# Patient Record
Sex: Female | Born: 1970 | Race: Black or African American | Hispanic: No | Smoking: Never smoker
Health system: Southern US, Community
[De-identification: ages and names within clinical notes are randomized; demographics above are authoritative.]

## PROBLEM LIST (undated history)

## (undated) DIAGNOSIS — K635 Polyp of colon: Secondary | ICD-10-CM

## (undated) DIAGNOSIS — Z86718 Personal history of other venous thrombosis and embolism: Secondary | ICD-10-CM

## (undated) DIAGNOSIS — Z8619 Personal history of other infectious and parasitic diseases: Secondary | ICD-10-CM

## (undated) DIAGNOSIS — Z87440 Personal history of urinary (tract) infections: Secondary | ICD-10-CM

## (undated) DIAGNOSIS — F419 Anxiety disorder, unspecified: Secondary | ICD-10-CM

## (undated) DIAGNOSIS — Z5189 Encounter for other specified aftercare: Secondary | ICD-10-CM

## (undated) DIAGNOSIS — N809 Endometriosis, unspecified: Secondary | ICD-10-CM

## (undated) DIAGNOSIS — R32 Unspecified urinary incontinence: Secondary | ICD-10-CM

## (undated) DIAGNOSIS — R7989 Other specified abnormal findings of blood chemistry: Secondary | ICD-10-CM

## (undated) DIAGNOSIS — F32A Depression, unspecified: Secondary | ICD-10-CM

## (undated) DIAGNOSIS — R42 Dizziness and giddiness: Secondary | ICD-10-CM

## (undated) DIAGNOSIS — F329 Major depressive disorder, single episode, unspecified: Secondary | ICD-10-CM

## (undated) DIAGNOSIS — R011 Cardiac murmur, unspecified: Secondary | ICD-10-CM

## (undated) DIAGNOSIS — IMO0001 Reserved for inherently not codable concepts without codable children: Secondary | ICD-10-CM

## (undated) DIAGNOSIS — Z8742 Personal history of other diseases of the female genital tract: Secondary | ICD-10-CM

## (undated) DIAGNOSIS — R102 Pelvic and perineal pain: Secondary | ICD-10-CM

## (undated) HISTORY — DX: Other specified abnormal findings of blood chemistry: R79.89

## (undated) HISTORY — PX: WISDOM TOOTH EXTRACTION: SHX21

## (undated) HISTORY — DX: Polyp of colon: K63.5

## (undated) HISTORY — DX: Personal history of urinary (tract) infections: Z87.440

## (undated) HISTORY — DX: Cardiac murmur, unspecified: R01.1

## (undated) HISTORY — DX: Unspecified urinary incontinence: R32

## (undated) HISTORY — DX: Depression, unspecified: F32.A

## (undated) HISTORY — DX: Personal history of other diseases of the female genital tract: Z87.42

## (undated) HISTORY — DX: Major depressive disorder, single episode, unspecified: F32.9

## (undated) HISTORY — DX: Encounter for other specified aftercare: Z51.89

## (undated) HISTORY — DX: Anxiety disorder, unspecified: F41.9

## (undated) HISTORY — PX: HERNIA REPAIR: SHX51

## (undated) HISTORY — DX: Endometriosis, unspecified: N80.9

## (undated) HISTORY — DX: Reserved for inherently not codable concepts without codable children: IMO0001

## (undated) HISTORY — DX: Personal history of other venous thrombosis and embolism: Z86.718

## (undated) HISTORY — DX: Pelvic and perineal pain: R10.2

## (undated) HISTORY — DX: Dizziness and giddiness: R42

## (undated) HISTORY — DX: Personal history of other infectious and parasitic diseases: Z86.19

---

## 2011-05-05 ENCOUNTER — Emergency Department (INDEPENDENT_AMBULATORY_CARE_PROVIDER_SITE_OTHER)
Admission: EM | Admit: 2011-05-05 | Discharge: 2011-05-05 | Disposition: A | Discharge status: Home / Self Care | Payer: PRIVATE HEALTH INSURANCE | Source: Home / Self Care | Attending: Emergency Medicine | Admitting: Emergency Medicine

## 2011-05-05 ENCOUNTER — Encounter: Payer: Self-pay | Admitting: *Deleted

## 2011-05-05 DIAGNOSIS — N949 Unspecified condition associated with female genital organs and menstrual cycle: Secondary | ICD-10-CM

## 2011-05-05 DIAGNOSIS — R102 Pelvic and perineal pain: Secondary | ICD-10-CM

## 2011-05-05 MED ORDER — TRAMADOL HCL 50 MG PO TABS: 100.0000 mg | ORAL_TABLET | Freq: Three times a day (TID) | ORAL | Status: AC | PRN

## 2011-05-05 MED ORDER — DICLOFENAC SODIUM 75 MG PO TBEC: 75.0000 mg | DELAYED_RELEASE_TABLET | Freq: Two times a day (BID) | ORAL | Status: DC

## 2011-05-05 NOTE — ED Notes (Signed)
Pt with onset of menstrual cycle Thursday 12/28 was two weeks late - pt with c/o abdominal pain which is relieved with pamprin and low back pain - vaginal bleeding slightly heavier than usual

## 2011-05-05 NOTE — ED Provider Notes (Signed)
History     CSN: [id number]  Arrival date & time 05/05/11  1706   First MD Initiated Contact with Patient 05/05/11 1721      Chief Complaint  Patient presents with  . Abdominal Pain  . Nausea  . 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 Over the past 6 months her menses have been somewhat irregular. She's having cramps and heavy bleeding with her menses. Her prior normal menstrual period was November 14 and was somewhat heavy. She then began with another menstrual period 6 weeks later which is a little bit late for her on December 28. Ever since then she's had pain in her left lower quadrant which now is bilateral. It sometimes radiates down the left leg with numbness and tingling in the leg and some lower back pain and she felt nausea but no vomiting. The bleeding is heavy with clots and she's felt somewhat chilled. She denies any fever. No vomiting. No urinary symptoms. The patient has had no sexual activity since 2007.  Patient is a 40 y.o. female presenting with abdominal pain.  Abdominal Pain The primary symptoms of the illness include abdominal pain. The primary symptoms of the illness do not include fever, nausea, vomiting, diarrhea, dysuria, vaginal discharge or vaginal bleeding.  Symptoms associated with the illness do not include chills, urgency, hematuria or frequency.    Past Medical History  Diagnosis Date  . Depression     Past Surgical History  Procedure Date  . Cesarean section   . Hernia repair     Family History  Problem Relation Age of Onset  . Cancer Mother   . Diabetes Father   . Hypertension Father   . Asthma Sister     History  Substance Use Topics  . Smoking status: Never Smoker   . Smokeless tobacco: Not on file  . Alcohol Use: No    OB History    Grav Para Term Preterm Abortions  TAB SAB Ect Mult Living                  Review of Systems  Constitutional: Negative for fever and chills.  Gastrointestinal: Positive for abdominal pain. Negative for nausea, vomiting and diarrhea.  Genitourinary: Positive for menstrual problem and pelvic pain. Negative for dysuria, urgency, frequency, hematuria, vaginal bleeding, vaginal discharge, difficulty urinating, genital sores, vaginal pain and dyspareunia.    Allergies  Review of patient's allergies indicates no known allergies.  Home Medications   Current Outpatient Rx  Name Route Sig Dispense Refill  . PAMPRIN MAX PO Oral Take by mouth.      . FLUOXETINE HCL 20 MG PO CAPS Oral Take 20 mg by mouth daily.      Marland Kitchen LORAZEPAM 0.5 MG PO TABS Oral Take 0.5 mg by mouth every 8 (eight) hours.      Marland Kitchen OVER THE COUNTER MEDICATION  Back aid max  (acetaminophen 500mg  and pamabrom 25mg      . TRAZODONE HCL 50 MG PO TABS Oral Take 50 mg by mouth at bedtime.      Marland Kitchen DICLOFENAC SODIUM 75 MG PO TBEC Oral Take 1 tablet (75 mg total) by mouth 2 (two) times daily. 20 tablet 0  . TRAMADOL HCL 50 MG PO TABS Oral Take 2 tablets (100 mg total) by mouth every 8 (eight) hours as needed for pain. Maximum dose= 8 tablets per day 30 tablet 0  BP 122/81  Pulse 62  Temp(Src) 98.1 F (36.7 C) (Oral)  Resp 16  SpO2 100%  LMP 05/02/2011  Physical Exam  Nursing note and vitals reviewed. Constitutional: She appears well-developed and well-nourished. No distress.  Cardiovascular: Normal rate, regular rhythm, normal heart sounds and intact distal pulses.  Exam reveals no gallop and no friction rub.   No murmur heard. Pulmonary/Chest: Effort normal and breath sounds normal. No respiratory distress. She has no wheezes. She has no rales.  Abdominal: Soft. Bowel sounds are normal. She exhibits no distension and no mass. There is no tenderness. There is no rebound and no guarding.  Genitourinary: There is no rash or lesion on the right labia. There is no  rash or lesion on the left labia. Uterus is not deviated, not enlarged, not fixed and not tender. Cervix exhibits no motion tenderness, no discharge and no friability. Right adnexum displays no mass and no tenderness. Left adnexum displays no mass and no tenderness. No erythema, tenderness or bleeding around the vagina. No foreign body around the vagina. No vaginal discharge found.       Pelvic exam reveals normal external genitalia. There was a moderate amount of blood in the vaginal vault and coming from the cervix. Insertion of the speculum cause pain. There is no discharge. On bimanual examination she did have pain with cervical motion, pain over the uterus, and over both adnexa is and tubes, left more so than right. Uterus was midposition, normal in size and shape. There were no adnexal masses.  Skin: Skin is warm and dry. No rash noted. She is not diaphoretic.    ED Course  Procedures (including critical care time)   Labs Reviewed  GC/CHLAMYDIA PROBE AMP, GENITAL   No results found.   1. Female pelvic pain       MDM  She has pelvic pain and irregular, abnormal menses. This may be due to ovarian cyst, or fibroid tumors. Given that she's not sexually active, pelvic inflammatory disease would be very unlikely. She was given medication for inflammation and for pain and was told that she must followup with a gynecologist within the next week.        Roque Lias, MD 05/05/11 2004

## 2011-05-06 ENCOUNTER — Inpatient Hospital Stay (HOSPITAL_COMMUNITY)
Admission: AD | Admit: 2011-05-06 | Discharge: 2011-05-06 | Disposition: A | Discharge status: Home / Self Care | Payer: PRIVATE HEALTH INSURANCE | Source: Ambulatory Visit | Attending: Obstetrics and Gynecology | Admitting: Obstetrics and Gynecology

## 2011-05-06 ENCOUNTER — Encounter (HOSPITAL_COMMUNITY): Payer: Self-pay | Admitting: *Deleted

## 2011-05-06 ENCOUNTER — Inpatient Hospital Stay (HOSPITAL_COMMUNITY): Payer: PRIVATE HEALTH INSURANCE

## 2011-05-06 DIAGNOSIS — N946 Dysmenorrhea, unspecified: Secondary | ICD-10-CM | POA: Insufficient documentation

## 2011-05-06 DIAGNOSIS — R109 Unspecified abdominal pain: Secondary | ICD-10-CM | POA: Insufficient documentation

## 2011-05-06 LAB — URINALYSIS, ROUTINE W REFLEX MICROSCOPIC
Bilirubin Urine: NEGATIVE
Nitrite: NEGATIVE
Specific Gravity, Urine: 1.015 (ref 1.005–1.030)
Urobilinogen, UA: 0.2 mg/dL (ref 0.0–1.0)

## 2011-05-06 LAB — GC/CHLAMYDIA PROBE AMP, GENITAL
Chlamydia, DNA Probe: NEGATIVE
GC Probe Amp, Genital: NEGATIVE

## 2011-05-06 LAB — CBC
MCV: 88.3 fL (ref 78.0–100.0)
Platelets: 253 10*3/uL (ref 150–400)
RBC: 4.36 MIL/uL (ref 3.87–5.11)
WBC: 5.6 10*3/uL (ref 4.0–10.5)

## 2011-05-06 LAB — DIFFERENTIAL
Lymphocytes Relative: 37 % (ref 12–46)
Lymphs Abs: 2.1 10*3/uL (ref 0.7–4.0)
Neutrophils Relative %: 51 % (ref 43–77)

## 2011-05-06 LAB — WET PREP, GENITAL: Trich, Wet Prep: NONE SEEN

## 2011-05-06 LAB — URINE MICROSCOPIC-ADD ON

## 2011-05-06 MED ORDER — NAPROXEN SODIUM 550 MG PO TABS: 550.0000 mg | ORAL_TABLET | Freq: Two times a day (BID) | ORAL | Status: AC

## 2011-05-06 MED ORDER — KETOROLAC TROMETHAMINE 60 MG/2ML IM SOLN
60.0000 mg | Freq: Once | INTRAMUSCULAR | Status: AC
  Administered 2011-05-06: 60 mg via INTRAMUSCULAR
  Filled 2011-05-06: qty 2

## 2011-05-06 NOTE — ED Provider Notes (Signed)
History     CSN: [id number]  Arrival date & time 05/06/11  1113   None     Chief Complaint  Patient presents with  . Pelvic Pain    HPI Jane Munoz is a 40 y.o. female who presents to [facility] for abdominal pain that started 4 days ago when period started. The pain got better until yesterday and then it came back worse. Went to Urgent Care last night and was given pain medication but today continues to have pain that comes and goes that is sharp stabbing in lower abdomen. Left >right. LMP 6 weeks before this one. The history was provided by the patient.  Past Medical History  Diagnosis Date  . Depression   . Anxiety     Past Surgical History  Procedure Date  . Cesarean section   . Hernia repair     Family History  Problem Relation Age of Onset  . Cancer Mother   . Diabetes Father   . Hypertension Father   . Asthma Sister     History  Substance Use Topics  . Smoking status: Never Smoker   . Smokeless tobacco: Not on file  . Alcohol Use: No    OB History    Grav Para Term Preterm Abortions TAB SAB Ect Mult Living   4 3 2 1 1  1   3       Review of Systems  Constitutional: Positive for chills. Negative for fever, diaphoresis and fatigue.  HENT: Negative for ear pain, congestion, sore throat, facial swelling, neck pain, neck stiffness, dental problem and sinus pressure.   Eyes: Negative for photophobia, pain and discharge.  Respiratory: Negative for cough, chest tightness and wheezing.   Cardiovascular: Negative.   Gastrointestinal: Positive for nausea and abdominal pain. Negative for vomiting, diarrhea, constipation and abdominal distention.  Genitourinary: Positive for vaginal bleeding and pelvic pain. Negative for dysuria, frequency, flank pain, vaginal discharge and difficulty urinating.  Musculoskeletal: Positive for back pain. Negative for myalgias and gait problem.  Skin: Negative for color change and rash.  Neurological: Positive for dizziness. Negative for  speech difficulty, weakness, light-headedness, numbness and headaches.  Psychiatric/Behavioral: Negative for confusion and agitation. The patient is not nervous/anxious.     Allergies  Review of patient's allergies indicates no known allergies.  Home Medications  No current outpatient prescriptions on file.  BP 132/50  Pulse 58  Temp(Src) 98.6 F (37 C) (Oral)  Resp 18  Ht 5' (1.524 m)  Wt 187 lb (84.823 kg)  BMI 36.52 kg/m2  LMP 05/02/2011  Physical Exam  Nursing note and vitals reviewed. Constitutional: She is oriented to person, place, and time. She appears well-developed and well-nourished. No distress.  HENT:  Head: Normocephalic.  Eyes: EOM are normal.  Neck: Neck supple.  Cardiovascular: Normal rate.   Pulmonary/Chest: Effort normal.  Abdominal: Soft. There is tenderness.  Musculoskeletal: Normal range of motion.  Neurological: She is alert and oriented to person, place, and time. No cranial nerve deficit.  Skin: Skin is warm and dry.  Psychiatric: She has a normal mood and affect. Her behavior is normal. Judgment and thought content normal.   Results for orders placed during the hospital encounter of 05/06/11 (from the past 24 hour(s))  URINALYSIS, ROUTINE W REFLEX MICROSCOPIC     Status: Abnormal   Collection Time   05/06/11 11:55 AM      Component Value Range   Color, Urine AMBER (*) YELLOW    APPearance HAZY (*) CLEAR  Specific Gravity, Urine 1.015  1.005 - 1.030    pH 7.5  5.0 - 8.0    Glucose, UA NEGATIVE  NEGATIVE (mg/dL)   Hgb urine dipstick LARGE (*) NEGATIVE    Bilirubin Urine NEGATIVE  NEGATIVE    Ketones, ur NEGATIVE  NEGATIVE (mg/dL)   Protein, ur NEGATIVE  NEGATIVE (mg/dL)   Urobilinogen, UA 0.2  0.0 - 1.0 (mg/dL)   Nitrite NEGATIVE  NEGATIVE    Leukocytes, UA NEGATIVE  NEGATIVE   URINE MICROSCOPIC-ADD ON     Status: Abnormal   Collection Time   05/06/11 11:55 AM      Component Value Range   Squamous Epithelial / LPF FEW (*) RARE    WBC,  UA 0-2  <3 (WBC/hpf)   RBC / HPF 21-50  <3 (RBC/hpf)  POCT PREGNANCY, URINE     Status: Normal   Collection Time   05/06/11 11:58 AM      Component Value Range   Preg Test, Ur NEGATIVE    WET PREP, GENITAL     Status: Normal   Collection Time   05/06/11  1:00 PM      Component Value Range   Yeast, Wet Prep NONE SEEN  NONE SEEN    Trich, Wet Prep NONE SEEN  NONE SEEN    Clue Cells, Wet Prep NONE SEEN  NONE SEEN    WBC, Wet Prep HPF POC NONE SEEN  NONE SEEN   CBC     Status: Normal   Collection Time   05/06/11  1:00 PM      Component Value Range   WBC 5.6  4.0 - 10.5 (K/uL)   RBC 4.36  3.87 - 5.11 (MIL/uL)   Hemoglobin 12.8  12.0 - 15.0 (g/dL)   HCT 16.1  09.6 - 04.5 (%)   MCV 88.3  78.0 - 100.0 (fL)   MCH 29.4  26.0 - 34.0 (pg)   MCHC 33.2  30.0 - 36.0 (g/dL)   RDW 40.9  81.1 - 91.4 (%)   Platelets 253  150 - 400 (K/uL)  DIFFERENTIAL     Status: Normal   Collection Time   05/06/11  1:00 PM      Component Value Range   Neutrophils Relative 51  43 - 77 (%)   Neutro Abs 2.9  1.7 - 7.7 (K/uL)   Lymphocytes Relative 37  12 - 46 (%)   Lymphs Abs 2.1  0.7 - 4.0 (K/uL)   Monocytes Relative 8  3 - 12 (%)   Monocytes Absolute 0.4  0.1 - 1.0 (K/uL)   Eosinophils Relative 3  0 - 5 (%)   Eosinophils Absolute 0.2  0.0 - 0.7 (K/uL)   Basophils Relative 1  0 - 1 (%)   Basophils Absolute 0.1  0.0 - 0.1 (K/uL)   US Transvaginal Non-ob  05/06/2011  *RADIOLOGY REPORT*  Clinical Data: Intermittent pelvic pain  TRANSABDOMINAL AND TRANSVAGINAL ULTRASOUND OF PELVIS Technique:  Both transabdominal and transvaginal ultrasound examinations of the pelvis were performed. Transabdominal technique was performed for global imaging of the pelvis including uterus, ovaries, adnexal regions, and pelvic cul-de-sac.  Comparison: None.   It was necessary to proceed with endovaginal exam following the transabdominal exam to visualize the endometrium.  Findings:  Uterus: Measures approximately 7.0 x 3.1 x 3.3 cm.   Poorly visualized due to uterine position.  Endometrium: Measures approximately 8 mm in thickness.  Poorly visualized due to uterine position.  Right ovary:  Normal appearance/no adnexal mass,  measuring 1.9 x 1.2 x 1.5 cm.  Left ovary: Normal appearance/no adnexal mass, measuring 0.9 x 1.3 x 1.7 cm.  Other findings: No free fluid.  IMPRESSION: Uterus is suboptimally visualized due to uterine position. Endometrium measures approximately 8 mm in thickness.  No evidence of pelvic mass or other significant abnormality.  Original Report Authenticated By: Charline Bills, M.D.   US Pelvis Complete  05/06/2011  *RADIOLOGY REPORT*  Clinical Data: Intermittent pelvic pain  TRANSABDOMINAL AND TRANSVAGINAL ULTRASOUND OF PELVIS Technique:  Both transabdominal and transvaginal ultrasound examinations of the pelvis were performed. Transabdominal technique was performed for global imaging of the pelvis including uterus, ovaries, adnexal regions, and pelvic cul-de-sac.  Comparison: None.   It was necessary to proceed with endovaginal exam following the transabdominal exam to visualize the endometrium.  Findings:  Uterus: Measures approximately 7.0 x 3.1 x 3.3 cm.  Poorly visualized due to uterine position.  Endometrium: Measures approximately 8 mm in thickness.  Poorly visualized due to uterine position.  Right ovary:  Normal appearance/no adnexal mass, measuring 1.9 x 1.2 x 1.5 cm.  Left ovary: Normal appearance/no adnexal mass, measuring 0.9 x 1.3 x 1.7 cm.  Other findings: No free fluid.  IMPRESSION: Uterus is suboptimally visualized due to uterine position. Endometrium measures approximately 8 mm in thickness.  No evidence of pelvic mass or other significant abnormality.  Original Report Authenticated By: Charline Bills, M.D.   Assessment: Dysmenorrhea  Plan:  Toradol 60 mg IM   Rx Anaprox DS   Follow up with GYN Clinic, return here as needed.  ED Course  Procedures   MDM  Patient feeling better after  Toradol.        [place], NP 05/06/11 1556

## 2011-05-06 NOTE — ED Notes (Signed)
Pt was seen at Urgent Care yesterday and was given 2 types of pain medicines- Tramadol HCL 50 mg & Voltaren 75 mg; pain medicine helped the pain that pt was experiencing yesterday but the pain has changed today and the medicine is not helping; Last pain med taken at 1000;

## 2011-05-08 DIAGNOSIS — Z8742 Personal history of other diseases of the female genital tract: Secondary | ICD-10-CM

## 2011-05-08 DIAGNOSIS — N8003 Adenomyosis of the uterus: Secondary | ICD-10-CM

## 2011-05-08 DIAGNOSIS — N800 Endometriosis of uterus: Secondary | ICD-10-CM

## 2011-05-08 DIAGNOSIS — R102 Pelvic and perineal pain: Secondary | ICD-10-CM

## 2011-05-08 HISTORY — DX: Personal history of other diseases of the female genital tract: Z87.42

## 2011-05-08 HISTORY — DX: Endometriosis of uterus: N80.0

## 2011-05-08 HISTORY — DX: Adenomyosis of the uterus: N80.03

## 2011-05-08 HISTORY — DX: Pelvic and perineal pain: R10.2

## 2011-05-08 NOTE — ED Provider Notes (Signed)
Agree with above note.  Jane Munoz 05/08/2011 3:51 PM

## 2011-10-14 ENCOUNTER — Encounter: Payer: Self-pay | Admitting: Obstetrics and Gynecology

## 2011-10-14 ENCOUNTER — Ambulatory Visit (INDEPENDENT_AMBULATORY_CARE_PROVIDER_SITE_OTHER): Payer: PRIVATE HEALTH INSURANCE | Admitting: Obstetrics and Gynecology

## 2011-10-14 VITALS — BP 108/70 | Temp 98.4°F | Resp 16 | Ht 60.0 in | Wt 181.0 lb

## 2011-10-14 DIAGNOSIS — N946 Dysmenorrhea, unspecified: Secondary | ICD-10-CM

## 2011-10-14 DIAGNOSIS — N920 Excessive and frequent menstruation with regular cycle: Secondary | ICD-10-CM

## 2011-10-14 LAB — POCT URINALYSIS DIPSTICK
Bilirubin, UA: NEGATIVE
Glucose, UA: NEGATIVE
Ketones, UA: NEGATIVE
Leukocytes, UA: NEGATIVE

## 2011-10-14 LAB — POCT URINE PREGNANCY: Preg Test, Ur: NEGATIVE

## 2011-10-14 NOTE — Progress Notes (Signed)
Contraception: no New Medications: no Abdominal Pain: no  Fibroids: no Menopausal Symptoms: no Increased Stress: no  Hormone Therapy: no Vaginal Discharge: no Other: per pain is only when she starts her cycles.    Vaginal Discharge: no Kidney Stones: no Prior Eval: no  Odor: no Constipation: no Prior U/S: 04/2011  Fever: no Diarrhea: no Hx of Ovarian Cyst: no  Irreg. Periods: yes Rectal Bleeding: no Hx of STD-PID: no  Dyspareunia: no Vomiting: no Appendectomy: no   Dysuria: no Nausea: no Gall Bladder Ds: no  Frequency: yes Pregnant: no Other:   Urgency: yes Fibroids: no   Hematuria: no Endometriosis: no

## 2011-10-14 NOTE — Progress Notes (Signed)
40Y O seen by Dr. Su Hilt (05/2011) for dysmenorrhea and menorrhagia presents with no menses in March & April. Begain bleeding last Monday, June 1-heavy,  Changed a diaper twice a day and soaked through clothes at night.  Admits to 7/10 cramps.  Took Naproxen and it put her to sleep.After first day patient changed a pad 4 times a day.[place]: Back:no CVA tenderness      Abdomen: soft, non-tender     Pelvic: EGBUS-wnl except genital mutilation, vagina-normal,               cervix-no lesions                Uterus-appears normal size, no tenderness                but exam limited by habitus; adnexae-no tenderness   UPT-negative U/A-negative  A: Menorrhagia     H/O Irregular Bleeding     Dysmenorrhea  P: TSH, PRL-pending      Reviewed management options: observation, hormonal    Lysteda, ablation or hysterectomy    Patient wants to proceed with Mirena IUD      RTO-IUD insertion  Temeca Somma, PA-C

## 2011-10-14 NOTE — Progress Notes (Signed)
Addended by: Henreitta Leber on: 10/14/2011 03:09 PM   Modules accepted: Orders

## 2011-10-15 LAB — PROLACTIN: Prolactin: 12.2 ng/mL

## 2011-10-30 ENCOUNTER — Telehealth: Payer: Self-pay | Admitting: Obstetrics and Gynecology

## 2011-10-30 NOTE — Telephone Encounter (Signed)
Jane Munoz/Jane Munoz/Jane Munoz

## 2011-10-31 ENCOUNTER — Telehealth: Payer: Self-pay

## 2011-10-31 NOTE — Telephone Encounter (Signed)
Pt requesting test results. Jane Munoz

## 2011-10-31 NOTE — Telephone Encounter (Signed)
TC TO PT REGARDING TEST RESULTS.INFORMED PT THAT HER LAB RESULTS TSH,PRL WERE ALL WNL. PT VOICED UNDERSTANDING

## 2011-11-18 ENCOUNTER — Encounter: Payer: Self-pay | Admitting: Obstetrics and Gynecology

## 2011-11-18 ENCOUNTER — Ambulatory Visit (INDEPENDENT_AMBULATORY_CARE_PROVIDER_SITE_OTHER): Payer: PRIVATE HEALTH INSURANCE | Admitting: Obstetrics and Gynecology

## 2011-11-18 VITALS — BP 100/70 | Ht 60.0 in | Wt 170.0 lb

## 2011-11-18 DIAGNOSIS — Z30430 Encounter for insertion of intrauterine contraceptive device: Secondary | ICD-10-CM

## 2011-11-18 DIAGNOSIS — Z975 Presence of (intrauterine) contraceptive device: Secondary | ICD-10-CM

## 2011-11-18 DIAGNOSIS — IMO0001 Reserved for inherently not codable concepts without codable children: Secondary | ICD-10-CM

## 2011-11-18 DIAGNOSIS — Z30431 Encounter for routine checking of intrauterine contraceptive device: Secondary | ICD-10-CM

## 2011-11-18 LAB — POCT URINE PREGNANCY: Preg Test, Ur: NEGATIVE

## 2011-11-18 MED ORDER — LEVONORGESTREL 20 MCG/24HR IU IUD
INTRAUTERINE_SYSTEM | Freq: Once | INTRAUTERINE | Status: AC
  Administered 2011-11-18: 1 via INTRAUTERINE

## 2011-11-18 NOTE — Progress Notes (Signed)
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

## 2011-12-31 ENCOUNTER — Encounter: Payer: Self-pay | Admitting: Obstetrics and Gynecology

## 2011-12-31 ENCOUNTER — Ambulatory Visit (INDEPENDENT_AMBULATORY_CARE_PROVIDER_SITE_OTHER): Payer: PRIVATE HEALTH INSURANCE | Admitting: Obstetrics and Gynecology

## 2011-12-31 ENCOUNTER — Other Ambulatory Visit: Payer: Self-pay

## 2011-12-31 ENCOUNTER — Ambulatory Visit (INDEPENDENT_AMBULATORY_CARE_PROVIDER_SITE_OTHER): Payer: PRIVATE HEALTH INSURANCE

## 2011-12-31 VITALS — BP 120/78 | Resp 14 | Ht 60.0 in | Wt 168.0 lb

## 2011-12-31 DIAGNOSIS — Z30431 Encounter for routine checking of intrauterine contraceptive device: Secondary | ICD-10-CM

## 2011-12-31 DIAGNOSIS — Z975 Presence of (intrauterine) contraceptive device: Secondary | ICD-10-CM

## 2011-12-31 NOTE — Progress Notes (Addendum)
Ultrasound today he uterus 8.2 x 3.6 x 4.3 cm IUD appears to be within proper position in the cavity the left ovary has a simple cyst/dominant follicle measuring 3.2 cm otherwise ovaries within normal limits and right ovary is within normal limits  C/o  Mild low back discomfort intermittent and relieved with OTC meds since IUD insertion  Filed Vitals:   12/31/11 0926  BP: 120/78  Resp: 14   ROS: noncontributory  Pelvic exam:  VULVA: normal appearing vulva with no masses, tenderness or lesions,  VAGINA: female circumcision, normal appearing vagina with normal color and discharge, no lesions, CERVIX: normal appearing cervix without discharge or lesions, IUD string visible UTERUS: uterus is normal size, shape, consistency and nontender,  ADNEXA: normal adnexa in size, nontender and no masses.  A/P AEX when due Will cont  IUD for now and obs If pain persists and pt wants IUD removed, return to office sooner

## 2012-04-21 ENCOUNTER — Telehealth: Payer: Self-pay | Admitting: Obstetrics and Gynecology

## 2012-04-21 NOTE — Telephone Encounter (Signed)
*  Unable to leave vm due to full mailbox ?

## 2012-10-14 ENCOUNTER — Emergency Department (HOSPITAL_COMMUNITY)
Admission: EM | Admit: 2012-10-14 | Discharge: 2012-10-14 | Disposition: A | Discharge status: Home / Self Care | Payer: BC Managed Care – PPO | Attending: Emergency Medicine | Admitting: Emergency Medicine

## 2012-10-14 ENCOUNTER — Encounter (HOSPITAL_COMMUNITY): Payer: Self-pay | Admitting: Emergency Medicine

## 2012-10-14 DIAGNOSIS — Y92410 Unspecified street and highway as the place of occurrence of the external cause: Secondary | ICD-10-CM | POA: Insufficient documentation

## 2012-10-14 DIAGNOSIS — Z8619 Personal history of other infectious and parasitic diseases: Secondary | ICD-10-CM | POA: Insufficient documentation

## 2012-10-14 DIAGNOSIS — F410 Panic disorder [episodic paroxysmal anxiety] without agoraphobia: Secondary | ICD-10-CM | POA: Insufficient documentation

## 2012-10-14 DIAGNOSIS — F3289 Other specified depressive episodes: Secondary | ICD-10-CM | POA: Insufficient documentation

## 2012-10-14 DIAGNOSIS — F329 Major depressive disorder, single episode, unspecified: Secondary | ICD-10-CM | POA: Insufficient documentation

## 2012-10-14 DIAGNOSIS — Y9389 Activity, other specified: Secondary | ICD-10-CM | POA: Insufficient documentation

## 2012-10-14 DIAGNOSIS — E119 Type 2 diabetes mellitus without complications: Secondary | ICD-10-CM | POA: Insufficient documentation

## 2012-10-14 DIAGNOSIS — Z79899 Other long term (current) drug therapy: Secondary | ICD-10-CM | POA: Insufficient documentation

## 2012-10-14 DIAGNOSIS — S298XXA Other specified injuries of thorax, initial encounter: Secondary | ICD-10-CM | POA: Insufficient documentation

## 2012-10-14 MED ORDER — LORAZEPAM 1 MG PO TABS
1.0000 mg | ORAL_TABLET | Freq: Once | ORAL | Status: AC
  Administered 2012-10-14: 1 mg via ORAL
  Filled 2012-10-14: qty 1
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
## 2012-10-14 NOTE — ED Notes (Signed)
Pt states hx of panic attacks.  Takes lorazepam and prozac.  Last took ativan last night and prozac was the day before yesterday.  States that she is having a panic attack.  States she feels SOB and anxious.  Pt appears to be very calm, talking in a very soft voice.

## 2012-10-15 NOTE — ED Provider Notes (Signed)
Medical screening examination/treatment/procedure(s) were performed by non-physician practitioner and as supervising physician I was immediately available for consultation/collaboration.  Kelvin Burpee T Chanae Gemma, MD 10/15/12 1310 

## 2013-03-01 ENCOUNTER — Emergency Department (HOSPITAL_COMMUNITY): Payer: BC Managed Care – PPO

## 2013-03-01 ENCOUNTER — Encounter (HOSPITAL_COMMUNITY): Payer: Self-pay | Admitting: Emergency Medicine

## 2013-03-01 ENCOUNTER — Emergency Department (HOSPITAL_COMMUNITY)
Admission: EM | Admit: 2013-03-01 | Discharge: 2013-03-01 | Disposition: A | Discharge status: Home / Self Care | Payer: BC Managed Care – PPO | Attending: Emergency Medicine | Admitting: Emergency Medicine

## 2013-03-01 DIAGNOSIS — M6281 Muscle weakness (generalized): Secondary | ICD-10-CM | POA: Insufficient documentation

## 2013-03-01 DIAGNOSIS — R51 Headache: Secondary | ICD-10-CM | POA: Insufficient documentation

## 2013-03-01 DIAGNOSIS — Z79899 Other long term (current) drug therapy: Secondary | ICD-10-CM | POA: Insufficient documentation

## 2013-03-01 DIAGNOSIS — R002 Palpitations: Secondary | ICD-10-CM

## 2013-03-01 DIAGNOSIS — R32 Unspecified urinary incontinence: Secondary | ICD-10-CM | POA: Insufficient documentation

## 2013-03-01 DIAGNOSIS — Z86718 Personal history of other venous thrombosis and embolism: Secondary | ICD-10-CM | POA: Insufficient documentation

## 2013-03-01 DIAGNOSIS — R110 Nausea: Secondary | ICD-10-CM | POA: Insufficient documentation

## 2013-03-01 DIAGNOSIS — F411 Generalized anxiety disorder: Secondary | ICD-10-CM | POA: Insufficient documentation

## 2013-03-01 DIAGNOSIS — Z5189 Encounter for other specified aftercare: Secondary | ICD-10-CM | POA: Insufficient documentation

## 2013-03-01 DIAGNOSIS — F329 Major depressive disorder, single episode, unspecified: Secondary | ICD-10-CM | POA: Insufficient documentation

## 2013-03-01 DIAGNOSIS — R42 Dizziness and giddiness: Secondary | ICD-10-CM | POA: Insufficient documentation

## 2013-03-01 DIAGNOSIS — IMO0001 Reserved for inherently not codable concepts without codable children: Secondary | ICD-10-CM | POA: Insufficient documentation

## 2013-03-01 DIAGNOSIS — Z87898 Personal history of other specified conditions: Secondary | ICD-10-CM | POA: Insufficient documentation

## 2013-03-01 DIAGNOSIS — R5381 Other malaise: Secondary | ICD-10-CM | POA: Insufficient documentation

## 2013-03-01 DIAGNOSIS — Z8742 Personal history of other diseases of the female genital tract: Secondary | ICD-10-CM | POA: Insufficient documentation

## 2013-03-01 DIAGNOSIS — F3289 Other specified depressive episodes: Secondary | ICD-10-CM | POA: Insufficient documentation

## 2013-03-01 DIAGNOSIS — Z8619 Personal history of other infectious and parasitic diseases: Secondary | ICD-10-CM | POA: Insufficient documentation

## 2013-03-01 DIAGNOSIS — R000 Tachycardia, unspecified: Secondary | ICD-10-CM | POA: Insufficient documentation

## 2013-03-01 DIAGNOSIS — E119 Type 2 diabetes mellitus without complications: Secondary | ICD-10-CM | POA: Insufficient documentation

## 2013-03-01 DIAGNOSIS — Z3202 Encounter for pregnancy test, result negative: Secondary | ICD-10-CM | POA: Insufficient documentation

## 2013-03-01 DIAGNOSIS — Z87440 Personal history of urinary (tract) infections: Secondary | ICD-10-CM | POA: Insufficient documentation

## 2013-03-01 LAB — COMPREHENSIVE METABOLIC PANEL
ALT: 23 U/L (ref 0–35)
AST: 25 U/L (ref 0–37)
Alkaline Phosphatase: 130 U/L — ABNORMAL HIGH (ref 39–117)
CO2: 26 mEq/L (ref 19–32)
Calcium: 9.7 mg/dL (ref 8.4–10.5)
GFR calc non Af Amer: >90 mL/min (ref >90)
Potassium: 3.8 mEq/L (ref 3.5–5.1)
Sodium: 136 mEq/L (ref 135–145)
Total Protein: 8.5 g/dL — ABNORMAL HIGH (ref 6.0–8.3)

## 2013-03-01 LAB — URINALYSIS, ROUTINE W REFLEX MICROSCOPIC
Bilirubin Urine: NEGATIVE
Hgb urine dipstick: NEGATIVE
Specific Gravity, Urine: 1.007 (ref 1.005–1.030)
pH: 7 (ref 5.0–8.0)

## 2013-03-01 LAB — CBC
MCH: 30.5 pg (ref 26.0–34.0)
Platelets: 273 10*3/uL (ref 150–400)
RBC: 4.91 MIL/uL (ref 3.87–5.11)
WBC: 6.1 10*3/uL (ref 4.0–10.5)

## 2013-03-01 LAB — PREGNANCY, URINE: Preg Test, Ur: NEGATIVE

## 2013-03-01 LAB — POCT I-STAT TROPONIN I: Troponin i, poc: 0 ng/mL (ref 0.00–0.08)

## 2013-03-01 MED ORDER — SODIUM CHLORIDE 0.9 % IV BOLUS (SEPSIS)
1000.0000 mL | Freq: Once | INTRAVENOUS | Status: AC
  Administered 2013-03-01: 1000 mL via INTRAVENOUS

## 2013-03-01 MED ORDER — IOHEXOL 350 MG/ML SOLN
80.0000 mL | Freq: Once | INTRAVENOUS | Status: AC | PRN
Start: 2013-03-01 — End: 2013-03-01
  Administered 2013-03-01: 80 mL via INTRAVENOUS

## 2013-03-01 NOTE — ED Notes (Signed)
C/o intermittent episodes of palpitations & "heart pounding" upon standing since this morning. Also c/o generalized weakness & body aches, chills, nausea no emesis & lightheadedness. Denies CP, SOB, diarrhea, urinary symptoms. Has been eating & drinking normally up until today

## 2013-03-01 NOTE — ED Notes (Signed)
Patient transported to CT 

## 2013-03-01 NOTE — ED Provider Notes (Signed)
Pt signed out to me by Jane Piccolo, PA-C. Plan is to wait for CT angio.  If negative, will reassess.  If positive, admit for PE.  CT angio chest: negative.  Reassessed pt and discussed findings with pt.  Pt stated she feels comfortable being discharged home to f/u with a primary care provider. Pt denied any need for refills on medications.   All labs/imaging/findings discussed with patient. All questions answered and concerns addressed. Will discharge pt home and have pt f/u with [facility] and [facility] info provided. Return precautions given. Pt verbalized understanding and agreement with tx plan. Vitals: unremarkable. Discharged in stable condition.    Discussed pt with attending during ED encounter and agrees with plan.   Junius Finner, PA-C 03/01/13 1829

## 2013-03-01 NOTE — ED Notes (Signed)
IV team at bedside 

## 2013-03-01 NOTE — ED Notes (Signed)
In [place] came back August 5 2-14

## 2013-03-01 NOTE — ED Provider Notes (Signed)
CSN: [id number]     Arrival date & time 03/01/13  1212 History   First MD Initiated Contact with Patient 03/01/13 1231     Chief Complaint  Patient presents with  . Palpitations   (Consider location/radiation/quality/duration/timing/severity/associated sxs/prior Treatment) HPI Comments: Patient is a 42 year old female past medical history significant for depression, anxiety, 2 DVTs, DM presenting to the emergency department for acute onset of palpitations with associated lightheadedness and nausea. Patient states yesterday she had been feeling well, but states she did notice a generalized headache. Patient states she woke up this morning the headache was gone but she began to feel palpitations whenever she stood up with associated lightheadedness, fatigue, myalgias. Patient states she [facility] and was told to come to the emergency department because they had her up to a monitor and noticed she got tachycardic up to 110. Patient states she is no longer having episodes of palpitations, but still endorsing fatigue, myalgias, lightheadedness. She denies any alleviating factors. No known sick contacts. Last menstrual period 5 weeks ago, patient had her IUD removed 6-7 weeks prior to last menstrual period. Patient denies any unilateral leg swelling, recent prolonged travel, immobilization, surgery. Patient is not on any chronic blood thinner.   Past Medical History  Diagnosis Date  . Depression   . Anxiety   . H/O varicella   . History of measles, mumps, or rubella   . Blood transfusion   . H/O blood clots   . H/O bladder infections   . Diabetes mellitus   . Leaking of urine   . Preterm labor   . H/O dysmenorrhea 05/08/11  . Adenomyosis 05/08/11  . Pelvic pain 1/2/`13   Past Surgical History  Procedure Laterality Date  . Hernia repair    . Wisdom tooth extraction    . Cesarean section       x 3   Family History  Problem Relation Age of Onset  .  Cancer Mother   . Diabetes Father   . Hypertension Father   . Asthma Sister    History  Substance Use Topics  . Smoking status: Never Smoker   . Smokeless tobacco: Never Used  . Alcohol Use: No   OB History   Grav Para Term Preterm Abortions TAB SAB Ect Mult Living   4 3 2 1 1  1   3      Review of Systems  Constitutional: Positive for fatigue. Negative for fever.  Respiratory: Negative for cough, chest tightness and shortness of breath.   Cardiovascular: Positive for palpitations.  Gastrointestinal: Positive for nausea. Negative for vomiting, abdominal pain and diarrhea.  Musculoskeletal: Positive for myalgias.  All other systems reviewed and are negative.    Allergies  Review of patient's allergies indicates no known allergies.  Home Medications   Current Outpatient Rx  Name  Route  Sig  Dispense  Refill  . FLUoxetine (PROZAC) 20 MG capsule   Oral   Take 20 mg by mouth daily.         Marland Kitchen LORazepam (ATIVAN) 0.5 MG tablet   Oral   Take 0.5 mg by mouth 2 (two) times daily as needed. For anxiety         . Multiple Vitamin (MULTIVITAMIN WITH MINERALS) TABS   Oral   Take 1 tablet by mouth daily.          BP 128/81  Pulse 71  Temp(Src) 98.4 F (36.9 C) (Oral)  Resp 17  SpO2 100% Physical Exam  Constitutional: She is oriented to person, place, and time. She appears well-developed and well-nourished. No distress.  HENT:  Head: Normocephalic and atraumatic.  Right Ear: External ear normal.  Left Ear: External ear normal.  Nose: Nose normal.  Mouth/Throat: Oropharynx is clear and moist.  Eyes: Conjunctivae and EOM are normal. Pupils are equal, round, and reactive to light.  Neck: Normal range of motion. Neck supple.  Cardiovascular: Normal rate, regular rhythm, normal heart sounds and intact distal pulses.   Pulmonary/Chest: Effort normal and breath sounds normal. No respiratory distress. She exhibits no tenderness, no bony tenderness, no crepitus, no edema  and no deformity.  Abdominal: Soft. Bowel sounds are normal. There is no tenderness. There is no rigidity, no rebound and no guarding.  Musculoskeletal: Normal range of motion. She exhibits no edema.  Lymphadenopathy:    She has no cervical adenopathy.  Neurological: She is alert and oriented to person, place, and time.  Skin: Skin is warm and dry. No rash noted. She is not diaphoretic.  Psychiatric: She has a normal mood and affect.    ED Course  Procedures (including critical care time) Medications  sodium chloride 0.9 % bolus 1,000 mL (1,000 mLs Intravenous New Bag/Given 03/01/13 1340)    Labs Review Labs Reviewed  CBC  URINALYSIS, ROUTINE W REFLEX MICROSCOPIC  PREGNANCY, URINE  COMPREHENSIVE METABOLIC PANEL  D-DIMER, QUANTITATIVE  POCT I-STAT TROPONIN I   Imaging Review Dg Chest 2 View  03/01/2013   CLINICAL DATA:  Palpitations. Fatigue.  EXAM: CHEST  2 VIEW  COMPARISON:  None.  FINDINGS: The heart size and mediastinal contours are within normal limits. Both lungs are clear. The visualized skeletal structures are unremarkable.  IMPRESSION: No active cardiopulmonary disease.   Electronically Signed   By: Geanie Cooley M.D.   On: 03/01/2013 13:29    EKG Interpretation   None       MDM  No diagnosis found.  Afebrile, NAD, non-toxic appearing, AAOx4. CXR, EKG, and Troponin unremarkable. D-dimer elevated. Will obtain Chest CTA for PE evaluation. Pending at shift change. Signed out to [employer], [employer]. Patient will be admitted for anticoagulation if CTA returns with findings for PE. Stable at shift change. Patient d/w with Dr. Deretha Emory, agrees with plan.       Jeannetta Ellis, PA-C 03/01/13 1615

## 2013-03-01 NOTE — ED Notes (Signed)
Started to feel bad this am and feels her pound when she stands up had a h/ a dn was nauseaous yesterday and today

## 2013-03-01 NOTE — ED Notes (Signed)
Patient transported to X-ray 

## 2013-03-02 NOTE — ED Provider Notes (Signed)
Medical screening examination/treatment/procedure(s) were performed by non-physician practitioner and as supervising physician I was immediately available for consultation/collaboration.  EKG Interpretation   None         Shelda Jakes, MD 03/02/13 [phone]

## 2013-03-05 ENCOUNTER — Encounter: Payer: Self-pay | Admitting: Cardiovascular Disease

## 2013-03-05 ENCOUNTER — Ambulatory Visit (INDEPENDENT_AMBULATORY_CARE_PROVIDER_SITE_OTHER): Payer: BC Managed Care – PPO | Admitting: Cardiovascular Disease

## 2013-03-05 VITALS — BP 118/68 | HR 76 | Ht 60.0 in | Wt 172.7 lb

## 2013-03-05 DIAGNOSIS — R791 Abnormal coagulation profile: Secondary | ICD-10-CM

## 2013-03-05 DIAGNOSIS — Q211 Atrial septal defect: Secondary | ICD-10-CM

## 2013-03-05 DIAGNOSIS — R7989 Other specified abnormal findings of blood chemistry: Secondary | ICD-10-CM

## 2013-03-05 DIAGNOSIS — R002 Palpitations: Secondary | ICD-10-CM

## 2013-03-05 DIAGNOSIS — R079 Chest pain, unspecified: Secondary | ICD-10-CM

## 2013-03-05 NOTE — Patient Instructions (Signed)
Your physician has requested that you have an echocardiogram. Echocardiography is a painless test that uses sound waves to create images of your heart. It provides your doctor with information about the size and shape of your heart and how well your heart's chambers and valves are working. This procedure takes approximately one hour. There are no restrictions for this procedure.  Your physician has recommended that you wear an event monitor. Event monitors are medical devices that record the heart's electrical activity. Doctors most often [place] these monitors to diagnose arrhythmias. Arrhythmias are problems with the speed or rhythm of the heartbeat. The monitor is a small, portable device. You can wear one while you do your normal daily activities. This is usually used to diagnose what is causing palpitations/syncope (passing out).  Your physician recommends that you schedule a follow-up appointment in: 3 weeks

## 2013-03-06 DIAGNOSIS — R7989 Other specified abnormal findings of blood chemistry: Secondary | ICD-10-CM

## 2013-03-06 HISTORY — DX: Other specified abnormal findings of blood chemistry: R79.89

## 2013-03-06 NOTE — Assessment & Plan Note (Signed)
Recommend an event monitor

## 2013-03-06 NOTE — ED Provider Notes (Signed)
Medical screening examination/treatment/procedure(s) were performed by non-physician practitioner and as supervising physician I was immediately available for consultation/collaboration.  EKG Interpretation     Ventricular Rate:  61 PR Interval:  180 QRS Duration: 76 QT Interval:  370 QTC Calculation: 372 R Axis:   71 Text Interpretation:  Normal sinus rhythm Low voltage QRS Borderline ECG ED PHYSICIAN INTERPRETATION AVAILABLE IN [facility]              Roney Marion, MD 03/06/13 1051

## 2013-03-06 NOTE — Progress Notes (Signed)
Patient ID: Jane Munoz, female   DOB: 08-Jul-1970, 42 y.o.   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 The evaluation was performed at that time for palpitations it sounds like she had a transthoracic echo but not a transesophageal study.. She has not had cardiology followup since.  Woke up Monday with a rapid heart beat evaluated at [facility] and sent to the ER found to have an abnormal D-Dimer, CXR and chest CT done which were normal.  Went back to the clinic yesterday and was told to see a cardiologist.  She did have chest pain and SOB this AM.   She has palpitations every week or two and he appeared fairly abrupt onset. She thinks that they are regular rather than chaotic. He usually began very abruptly but resolved gradually. She does not have a history of metabolic events  He has moderate exertional dyspnea. She has to stop after climbing a flight of stairs.  No Known Allergies  Current Outpatient Prescriptions  Medication Sig Dispense Refill  . FLUoxetine (PROZAC) 20 MG capsule Take 20 mg by mouth daily.      Marland Kitchen LORazepam (ATIVAN) 0.5 MG tablet Take 0.5 mg by mouth 2 (two) times daily as needed. For anxiety      . Multiple Vitamin (MULTIVITAMIN WITH MINERALS) TABS Take 1 tablet by mouth daily.       No current facility-administered medications for this visit.    Past Medical History  Diagnosis Date  . Depression   . Anxiety   . H/O varicella   . History of measles, mumps, or rubella   . Blood transfusion   . H/O blood clots   . H/O bladder infections   . Diabetes mellitus   . Leaking of urine   . Preterm labor   . H/O dysmenorrhea 05/08/11  . Adenomyosis 05/08/11  . Pelvic pain 1/2/`13     Past Surgical History  Procedure Laterality Date  . Hernia repair    . Wisdom tooth extraction    . Cesarean section       x 3    Family History  Problem Relation Age of Onset  . Cancer Mother   . Diabetes Father   . Hypertension Father   . Asthma Sister     History   Social History  . Marital Status: Divorced    Spouse Name: N/A    Number of Children: N/A  . Years of Education: N/A   Occupational History  . Not on file.   Social History Main Topics  . Smoking status: Never Smoker   . Smokeless tobacco: Never Used  . Alcohol Use: No  . Drug Use: No  . Sexual Activity: Not Currently   Other Topics Concern  . Not on file   Social History Narrative  . No narrative on file    Review of systems: The patient specifically denies any chest pain at rest or with exertion, dyspnea at rest, orthopnea, paroxysmal nocturnal dyspnea, syncope, focal neurological deficits, intermittent claudication, lower extremity edema, unexplained weight gain, cough, hemoptysis or wheezing.  The patient also denies abdominal pain, nausea, vomiting, dysphagia, diarrhea, constipation, polyuria, polydipsia, dysuria, hematuria, frequency, urgency, abnormal bleeding  or bruising, fever, chills, unexpected weight changes, mood swings, change in skin or hair texture, change in voice quality, auditory or visual problems, allergic reactions or rashes, new musculoskeletal complaints other than usual "aches and pains".   PHYSICAL EXAM BP 118/68  Pulse 76  Ht 5' (1.524 m)  Wt 172 lb 11.2 oz (78.336 kg)  BMI 33.73 kg/m2  General: Alert, oriented x3, no distress Head: no evidence of trauma, PERRL, EOMI, no exophtalmos or lid lag, no myxedema, no xanthelasma; normal ears, nose and oropharynx Neck: normal jugular venous pulsations and no hepatojugular reflux; brisk carotid pulses without delay and no carotid bruits Chest: clear to auscultation, no signs of consolidation by percussion or palpation,  normal fremitus, symmetrical and full respiratory excursions Cardiovascular: normal position and quality of the apical impulse, regular rhythm, normal first and second heart sounds, no murmurs, rubs or gallops Abdomen: no tenderness or distention, no masses by palpation, no abnormal pulsatility or arterial bruits, normal bowel sounds, no hepatosplenomegaly Extremities: no clubbing, cyanosis or edema; 2+ radial, ulnar and brachial pulses bilaterally; 2+ right femoral, posterior tibial and dorsalis pedis pulses; 2+ left femoral, posterior tibial and dorsalis pedis pulses; no subclavian or femoral bruits Neurological: grossly nonfocal   EKG: normal sinus rhythm, normal tracing; no evidence of right atrial enlargement right axis deviation or right ventricular conduction delay  BMET    Component Value Date/Time   NA 136 03/01/2013 1339   K 3.8 03/01/2013 1339   CL 99 03/01/2013 1339   CO2 26 03/01/2013 1339   GLUCOSE 82 03/01/2013 1339   BUN 9 03/01/2013 1339   CREATININE 0.54 03/01/2013 1339   CALCIUM 9.7 03/01/2013 1339   GFRNONAA >90 03/01/2013 1339   GFRAA >90 03/01/2013 1339     ASSESSMENT AND PLAN ASD (atrial septal defect) It is unclear from her description whether she was diagnosed with a PFO or an ASD. If the latter is the case, then this could explain dyspnea and arrhythmia (including atrial fibrillation). Recommend repeat a transthoracic echo, and if there is any evidence of right heart chamber enlargement, she should probably have a transesophageal echo to evaluate the ASD in more detail.  Palpitations Recommend an event monitor  Elevated d-dimer  This test has a very poor positive predictive value. With a negative CT angiogram and absence of any signs or symptoms of DVT I don't think I would pursue this any further.   Orders Placed This Encounter  Procedures  . EKG 12-Lead  . Cardiac event monitor  . 2D Echocardiogram with contrast   No orders of the defined types  were placed in this encounter.    Junious Silk, MD, [facility] [phone] office [phone] pager

## 2013-03-06 NOTE — Assessment & Plan Note (Signed)
It is unclear from her description whether she was diagnosed with a PFO or an ASD. If the latter is the case, then this could explain dyspnea and arrhythmia (including atrial fibrillation). Recommend repeat a transthoracic echo, and if there is any evidence of right heart chamber enlargement, she should probably have a transesophageal echo to evaluate the ASD in more detail.

## 2013-03-06 NOTE — Assessment & Plan Note (Signed)
This test has a very poor positive predictive value. With a negative CT angiogram and absence of any signs or symptoms of DVT I don't think I would pursue this any further.

## 2013-03-08 ENCOUNTER — Telehealth: Payer: Self-pay | Admitting: Cardiovascular Disease

## 2013-03-08 DIAGNOSIS — M79604 Pain in right leg: Secondary | ICD-10-CM

## 2013-03-08 NOTE — Telephone Encounter (Signed)
Pain in right side and right leg,also numbness.Please call asap-concerned about a blood clot.

## 2013-03-08 NOTE — Telephone Encounter (Signed)
Test ordered and appt scheduled for Wednesday at 3pm.    Returned call and informed pt per instructions by MD.  Also informed of appt date/time.  ER instructions given.  Pt verbalized understanding and agreed w/ plan.

## 2013-03-08 NOTE — Telephone Encounter (Signed)
Returned call and pt verified x 2.  Pt c/o pain in R leg/thigh since 10am.  Also c/o "a little bit of numbness" in R leg/thigh.  Denied redness, warmness or swelling.  C/o tenderness to touch and stated the other foot (left) is colder.  Denied difference in temp of legs/thighs.  Denied noticing discoloration in RLE.  Pt c/o CP and SOB after doing housework.  Denied either CP or SOB now.  Pt informed RN will notify Dr. Royann Shivers for further instructions as symptoms are not as alarming for DVT.  Pt verbalized understanding and agreed w/ plan.  Message forwarded to Dr. Royann Shivers.

## 2013-03-08 NOTE — Telephone Encounter (Signed)
Probably best to do a duplex ultrasound for possible DVT. Please schedule

## 2013-03-10 ENCOUNTER — Ambulatory Visit (HOSPITAL_COMMUNITY)
Admission: RE | Admit: 2013-03-10 | Discharge: 2013-03-10 | Disposition: A | Discharge status: Home / Self Care | Payer: BC Managed Care – PPO | Source: Ambulatory Visit | Attending: Cardiovascular Disease | Admitting: Cardiovascular Disease

## 2013-03-10 DIAGNOSIS — M79609 Pain in unspecified limb: Secondary | ICD-10-CM

## 2013-03-10 DIAGNOSIS — M79604 Pain in right leg: Secondary | ICD-10-CM

## 2013-03-10 NOTE — Progress Notes (Signed)
Right Lower Extremity Venous Duplex Completed. °Brianna L Mazza,RVT °

## 2013-03-11 ENCOUNTER — Other Ambulatory Visit: Payer: Self-pay

## 2013-03-15 ENCOUNTER — Telehealth: Payer: Self-pay | Admitting: Cardiovascular Disease

## 2013-03-15 NOTE — Telephone Encounter (Signed)
Returned call and pt verified x 2.  Pt informed message received.  Informed results have not been reviewed by MD and nurse will call, mail letter or release in MyChart after they are reviewed.  Pt also informed monitors have been on back order and she will receive it as soon as they are able to ship one.  Pt verbalized understanding and agreed w/ plan.

## 2013-03-15 NOTE — Telephone Encounter (Signed)
Wants of results doppler from last week.  Also has not received monitor that was ordered 10 days ago  Please call

## 2013-03-22 ENCOUNTER — Encounter (HOSPITAL_COMMUNITY): Payer: Self-pay | Admitting: Emergency Medicine

## 2013-03-22 ENCOUNTER — Observation Stay (HOSPITAL_COMMUNITY)
Admission: EM | Admit: 2013-03-22 | Discharge: 2013-03-26 | Disposition: A | Discharge status: Home / Self Care | Payer: BC Managed Care – PPO | Attending: Cardiovascular Disease | Admitting: Cardiovascular Disease

## 2013-03-22 ENCOUNTER — Ambulatory Visit: Payer: BC Managed Care – PPO | Admitting: Interventional Cardiology

## 2013-03-22 ENCOUNTER — Emergency Department (HOSPITAL_COMMUNITY): Payer: BC Managed Care – PPO

## 2013-03-22 DIAGNOSIS — R5381 Other malaise: Secondary | ICD-10-CM | POA: Insufficient documentation

## 2013-03-22 DIAGNOSIS — Z8619 Personal history of other infectious and parasitic diseases: Secondary | ICD-10-CM | POA: Insufficient documentation

## 2013-03-22 DIAGNOSIS — R531 Weakness: Secondary | ICD-10-CM

## 2013-03-22 DIAGNOSIS — Z86718 Personal history of other venous thrombosis and embolism: Secondary | ICD-10-CM | POA: Insufficient documentation

## 2013-03-22 DIAGNOSIS — Z87448 Personal history of other diseases of urinary system: Secondary | ICD-10-CM | POA: Insufficient documentation

## 2013-03-22 DIAGNOSIS — Z9189 Other specified personal risk factors, not elsewhere classified: Secondary | ICD-10-CM

## 2013-03-22 DIAGNOSIS — E860 Dehydration: Secondary | ICD-10-CM

## 2013-03-22 DIAGNOSIS — E875 Hyperkalemia: Secondary | ICD-10-CM

## 2013-03-22 DIAGNOSIS — R55 Syncope and collapse: Secondary | ICD-10-CM | POA: Insufficient documentation

## 2013-03-22 DIAGNOSIS — R002 Palpitations: Secondary | ICD-10-CM

## 2013-03-22 DIAGNOSIS — Q211 Atrial septal defect, unspecified: Secondary | ICD-10-CM

## 2013-03-22 DIAGNOSIS — R000 Tachycardia, unspecified: Secondary | ICD-10-CM

## 2013-03-22 DIAGNOSIS — F332 Major depressive disorder, recurrent severe without psychotic features: Secondary | ICD-10-CM

## 2013-03-22 DIAGNOSIS — Q2111 Secundum atrial septal defect: Secondary | ICD-10-CM | POA: Insufficient documentation

## 2013-03-22 DIAGNOSIS — I498 Other specified cardiac arrhythmias: Secondary | ICD-10-CM | POA: Insufficient documentation

## 2013-03-22 DIAGNOSIS — Z79899 Other long term (current) drug therapy: Secondary | ICD-10-CM | POA: Insufficient documentation

## 2013-03-22 DIAGNOSIS — R0989 Other specified symptoms and signs involving the circulatory and respiratory systems: Secondary | ICD-10-CM | POA: Insufficient documentation

## 2013-03-22 DIAGNOSIS — R079 Chest pain, unspecified: Secondary | ICD-10-CM

## 2013-03-22 DIAGNOSIS — F411 Generalized anxiety disorder: Secondary | ICD-10-CM

## 2013-03-22 DIAGNOSIS — I951 Orthostatic hypotension: Secondary | ICD-10-CM

## 2013-03-22 DIAGNOSIS — Z8751 Personal history of pre-term labor: Secondary | ICD-10-CM | POA: Insufficient documentation

## 2013-03-22 DIAGNOSIS — F329 Major depressive disorder, single episode, unspecified: Secondary | ICD-10-CM

## 2013-03-22 DIAGNOSIS — F341 Dysthymic disorder: Secondary | ICD-10-CM

## 2013-03-22 DIAGNOSIS — Z23 Encounter for immunization: Secondary | ICD-10-CM | POA: Insufficient documentation

## 2013-03-22 DIAGNOSIS — F32A Depression, unspecified: Secondary | ICD-10-CM

## 2013-03-22 DIAGNOSIS — R0609 Other forms of dyspnea: Secondary | ICD-10-CM | POA: Insufficient documentation

## 2013-03-22 DIAGNOSIS — E119 Type 2 diabetes mellitus without complications: Secondary | ICD-10-CM | POA: Insufficient documentation

## 2013-03-22 DIAGNOSIS — R071 Chest pain on breathing: Principal | ICD-10-CM | POA: Insufficient documentation

## 2013-03-22 DIAGNOSIS — R0600 Dyspnea, unspecified: Secondary | ICD-10-CM

## 2013-03-22 LAB — BASIC METABOLIC PANEL
CO2: 27 mEq/L (ref 19–32)
Chloride: 105 mEq/L (ref 96–112)
Potassium: 3.4 mEq/L — ABNORMAL LOW (ref 3.5–5.1)
Sodium: 141 mEq/L (ref 135–145)

## 2013-03-22 LAB — CBC
Platelets: 275 10*3/uL (ref 150–400)
RBC: 4.85 MIL/uL (ref 3.87–5.11)
WBC: 5.4 10*3/uL (ref 4.0–10.5)

## 2013-03-22 LAB — POCT I-STAT TROPONIN I: Troponin i, poc: 0 ng/mL (ref 0.00–0.08)

## 2013-03-22 LAB — TROPONIN I: Troponin I: <0.3 ng/mL (ref <0.30)

## 2013-03-22 MED ORDER — PANTOPRAZOLE SODIUM 40 MG PO TBEC
40.0000 mg | DELAYED_RELEASE_TABLET | Freq: Every day | ORAL | Status: DC
  Administered 2013-03-23 – 2013-03-26 (×4): 40 mg via ORAL
  Filled 2013-03-22 (×4): qty 1

## 2013-03-22 MED ORDER — ASPIRIN 81 MG PO CHEW
324.0000 mg | CHEWABLE_TABLET | Freq: Once | ORAL | Status: AC
  Administered 2013-03-22: 324 mg via ORAL
  Filled 2013-03-22: qty 4

## 2013-03-22 MED ORDER — NAPROXEN 375 MG PO TABS
375.0000 mg | ORAL_TABLET | Freq: Two times a day (BID) | ORAL | Status: DC
  Administered 2013-03-23 – 2013-03-26 (×7): 375 mg via ORAL
  Filled 2013-03-22 (×9): qty 1

## 2013-03-22 MED ORDER — ALPRAZOLAM 0.25 MG PO TABS
0.2500 mg | ORAL_TABLET | Freq: Three times a day (TID) | ORAL | Status: DC | PRN
  Administered 2013-03-22: 0.25 mg via ORAL
  Filled 2013-03-22: qty 1

## 2013-03-22 MED ORDER — ACETAMINOPHEN 325 MG PO TABS: 650.0000 mg | ORAL_TABLET | ORAL | Status: DC | PRN

## 2013-03-22 MED ORDER — ZOLPIDEM TARTRATE 5 MG PO TABS: 5.0000 mg | ORAL_TABLET | Freq: Every evening | ORAL | Status: DC | PRN

## 2013-03-22 MED ORDER — KETOROLAC TROMETHAMINE 30 MG/ML IJ SOLN
30.0000 mg | Freq: Once | INTRAMUSCULAR | Status: AC
  Administered 2013-03-22: 30 mg via INTRAMUSCULAR
  Filled 2013-03-22: qty 1

## 2013-03-22 MED ORDER — KETOROLAC TROMETHAMINE 30 MG/ML IJ SOLN: 30.0000 mg | Freq: Once | INTRAMUSCULAR | Status: DC

## 2013-03-22 MED ORDER — TRAMADOL HCL 50 MG PO TABS: 50.0000 mg | ORAL_TABLET | Freq: Four times a day (QID) | ORAL | Status: DC | PRN

## 2013-03-22 MED ORDER — ZOLPIDEM TARTRATE 5 MG PO TABS: 10.0000 mg | ORAL_TABLET | Freq: Every evening | ORAL | Status: DC | PRN

## 2013-03-22 MED ORDER — ENOXAPARIN SODIUM 80 MG/0.8ML ~~LOC~~ SOLN
80.0000 mg | Freq: Once | SUBCUTANEOUS | Status: AC
  Administered 2013-03-22: 21:00:00 80 mg via SUBCUTANEOUS
  Filled 2013-03-22 (×3): qty 0.8

## 2013-03-22 NOTE — ED Notes (Signed)
Cardiology PA at bedside. 

## 2013-03-22 NOTE — ED Notes (Signed)
C/o sudden onset at 0930 left side CP while laying in bed, + palpitations, SOB & nausea. States all symptoms resolved but CP continues. Describes CP as stabbing, sharp which increases with palpation, deep breaths & certain mvmts. Seen in ED for palpitations without CP 2 weeks ago. Since then seen cardiologist & placed on holter monitor. Has ECHO scheduled for this Thursday & to turn in monitor in another 2 weeks.  Denies fever, cold, cough.

## 2013-03-22 NOTE — ED Notes (Signed)
Pt c/o palpitations and SOB this am; pt sts recent hx of same; pt tearful and hyperventilating at present; pt wearing holter monitor

## 2013-03-22 NOTE — H&P (Signed)
Patient ID: Jane Munoz MRN: [id number], DOB/AGE: 1971/03/08   Admit date: 03/22/2013- 17:30   Primary Physician: Pcp Not In System Primary Cardiologist: Dr Royann Shivers  HPI: 42 y/o female seen by Dr Royann Shivers recently after she had an episode of chest pain with an elevated D-dimer. This was 03/01/13. CTA was negative for PE. She complained of palpitations and a monitor is in place. It has shown sinus rhythm, sinus tach despite multiple activations by the patient. This morning around 9:30 am she had tachycardia followed by localized Lt chest pain. She has been in the ER since 11:30am. Her EKG, CXR, and Troponin are normal. She continue to have some Lt chest pain. There is a pleuritic component. She denies fever, chills or hemoptysis.   Problem List: Past Medical History  Diagnosis Date  . Depression   . Anxiety   . H/O varicella   . History of measles, mumps, or rubella   . Blood transfusion   . H/O blood clots   . H/O bladder infections       . Leaking of urine   . Preterm labor   . H/O dysmenorrhea 05/08/11  . Adenomyosis 05/08/11  . Pelvic pain 1/2/`13    Past Surgical History  Procedure Laterality Date  . Hernia repair    . Wisdom tooth extraction    . Cesarean section       x 3     Allergies: No Known Allergies   Home Medications No current facility-administered medications for this encounter.   Current Outpatient Prescriptions  Medication Sig Dispense Refill  . FLUoxetine (PROZAC) 20 MG capsule Take 20 mg by mouth daily.      Marland Kitchen LORazepam (ATIVAN) 0.5 MG tablet Take 0.5 mg by mouth 2 (two) times daily as needed. For anxiety      . Multiple Vitamin (MULTIVITAMIN WITH MINERALS) TABS Take 1 tablet by mouth daily.         Family History  Problem Relation Age of Onset  . Cancer Mother   . Diabetes Father   . Hypertension Father   . Asthma Sister      History   Social History  . Marital Status: Divorced    Spouse Name: N/A    Number of Children: N/A  . Years  of Education: N/A   Occupational History  . Not on file.   Social History Main Topics  . Smoking status: Never Smoker   . Smokeless tobacco: Never Used  . Alcohol Use: No  . Drug Use: No  . Sexual Activity: Not Currently   Other Topics Concern  . Not on file   Social History Narrative  . No narrative on file  Divorced, lives with her Autistic 47 y/o daughter and 16 y/o son.   Review of Systems: General: negative for chills, fever, night sweats or weight changes.  Cardiovascular: negative for, orthopnea, paroxysmal nocturnal dyspnea  Dermatological: negative for rash Urologic: negative for hematuria Abdominal: negative for nausea, vomiting, diarrhea, bright red blood per rectum, melena, or hematemesis Neurologic: negative for visual changes, syncope, or dizziness All other systems reviewed and are otherwise negative except as noted above.  Physical Exam: Blood pressure 120/78, pulse 63, temperature 98.5 F (36.9 C), temperature source Oral, resp. rate 17, SpO2 100.00%.  General appearance: alert, cooperative, no distress and moderately obese Neck: no carotid bruit and no JVD Lungs: clear to auscultation bilaterally Heart: regular rate and rhythm Abdomen: soft, non-tender; bowel sounds normal; no masses,  no organomegaly Extremities: extremities normal,  atraumatic, no cyanosis or edema Pulses: 2+ and symmetric Skin: Skin color, texture, turgor normal. No rashes or lesions Neurologic: Grossly normal    Labs:   Results for orders placed during the hospital encounter of 03/22/13 (from the past 24 hour(s))  CBC     Status: None   Collection Time    03/22/13 10:44 AM      Result Value Range   WBC 5.4  4.0 - 10.5 K/uL   RBC 4.85  3.87 - 5.11 MIL/uL   Hemoglobin 14.6  12.0 - 15.0 g/dL   HCT 29.5  62.1 - 30.8 %   MCV 88.7  78.0 - 100.0 fL   MCH 30.1  26.0 - 34.0 pg   MCHC 34.0  30.0 - 36.0 g/dL   RDW 65.7  84.6 - 96.2 %   Platelets 275  150 - 400 K/uL  BASIC METABOLIC  PANEL     Status: Abnormal   Collection Time    03/22/13 10:44 AM      Result Value Range   Sodium 141  135 - 145 mEq/L   Potassium 3.4 (*) 3.5 - 5.1 mEq/L   Chloride 105  96 - 112 mEq/L   CO2 27  19 - 32 mEq/L   Glucose, Bld 105 (*) 70 - 99 mg/dL   BUN 11  6 - 23 mg/dL   Creatinine, Ser 9.52  0.50 - 1.10 mg/dL   Calcium 9.9  8.4 - 84.1 mg/dL   GFR calc non Af Amer >90  >90 mL/min   GFR calc Af Amer >90  >90 mL/min  POCT I-STAT TROPONIN I     Status: None   Collection Time    03/22/13 11:33 AM      Result Value Range   Troponin i, poc 0.00  0.00 - 0.08 ng/mL   Comment 3              Radiology/Studies: Dg Chest 2 View  03/22/2013   CLINICAL DATA:  Shortness of breath and left-sided chest pain.  EXAM: CHEST  2 VIEW  COMPARISON:  CT chest and chest radiograph 03/01/2013.  FINDINGS: Trachea is midline. Heart size normal. Lungs are clear. No pleural fluid.  IMPRESSION: No acute findings.   Electronically Signed   By: Leanna Battles M.D.   On: 03/22/2013 11:16   Dg Chest 2 View  03/01/2013   CLINICAL DATA:  Palpitations. Fatigue.  EXAM: CHEST  2 VIEW  COMPARISON:  None.  FINDINGS: The heart size and mediastinal contours are within normal limits. Both lungs are clear. The visualized skeletal structures are unremarkable.  IMPRESSION: No active cardiopulmonary disease.   Electronically Signed   By: Geanie Cooley M.D.   On: 03/01/2013 13:29   Ct Angio Chest Pe W/cm &/or Wo Cm  03/01/2013   CLINICAL DATA:  Intermittent palpitations, heart pounding, weakness, body aches, chills, nausea, lightheadedness, elevated D-dimer history of DVT x 2, diabetes  EXAM: CT ANGIOGRAPHY CHEST WITH CONTRAST  TECHNIQUE: Multidetector CT imaging of the chest was performed using the standard protocol during bolus administration of intravenous contrast. Multiplanar CT image reconstructions including MIPs were obtained to evaluate the vascular anatomy.  CONTRAST:  80mL OMNIPAQUE IOHEXOL 350 MG/ML SOLN  COMPARISON:   None  FINDINGS: Aorta normal caliber without aneurysm or dissection.  Visualized portion of upper abdomen normal appearance.  Pulmonary arteries patent.  No evidence pulmonary embolism.  Central pulmonary arteries upper normal size.  Dependent atelectasis in the posterior lungs.  No  definite pulmonary infiltrate, pleural effusion or pneumothorax.  No acute osseous findings.  Review of the MIP images confirms the above findings.  IMPRESSION: No evidence of pulmonary embolism.  Minimal dependent atelectasis in both lungs.   Electronically Signed   By: Ulyses Southward M.D.   On: 03/01/2013 18:13    EKG:NSR   ASSESSMENT AND PLAN:  Principal Problem:   Chest pain with minimal risk of acute coronary syndrome Active Problems:   Palpitations   Dyspnea   Elevated d-dimer- 1.29 on Oct 27th 2014- CTA neg for PE, LE venous dopplers neg for DVT 03/10/13   Anxiety and depression   Menorrhagia   Dysmenorrhea   IUD contraception-inserted 11/18/11   ASD (atrial septal defect)-? by history when she was pregnanat   PLAN: Discussed with Dr Allyson Sabal- admit for obs, cycle enzymes, echo in am. Add NSAID, PPI.   Deland Pretty, PA-C 03/22/2013, 5:26 PM  Agree with note written by Corine Shelter PAC  Atypical CP. No CRF. EQxam benign. Labs OK. Enz neg. EKG w/o acute changes. Doubt ACS. Was just in hospital recently and had a neg CTA for PE. Was wearing a monitor for Palp. Will put on Lovenox, 2 D echo in AM with bubbles to R/O PFO. If she rules out and echo nl can be D/Cd home after that tomorrow.    Runell Gess 03/22/2013 5:36 PM

## 2013-03-22 NOTE — ED Provider Notes (Signed)
Medical screening examination/treatment/procedure(s) were performed by non-physician practitioner and as supervising physician I was immediately available for consultation/collaboration.  EKG Interpretation    Date/Time:  Monday March 22 2013 10:34:56 EST Ventricular Rate:  82 PR Interval:  164 QRS Duration: 78 QT Interval:  358 QTC Calculation: 418 R Axis:   66 Text Interpretation:  Normal sinus rhythm Low voltage QRS Borderline ECG No significant change since last tracing             Ethelda Chick, MD 03/22/13 1747

## 2013-03-22 NOTE — ED Provider Notes (Signed)
CSN: [id number]     Arrival date & time 03/22/13  1029 History   First MD Initiated Contact with Patient 03/22/13 1540     Chief Complaint  Patient presents with  . Shortness of Breath   (Consider location/radiation/quality/duration/timing/severity/associated sxs/prior Treatment) HPI Comments: Patient presents to the ED with a chief complaint of CP that started this morning while at rest.  It was associated with SOB and nausea.  Patient has been seen here for the same before, and had a CT angio approx. 2 weeks ago which was negative.  Patient is being seen by Dr. Rubie Maid with [facility].  She is currently wearing a holter monitor and is scheduled to have an echo later this week.  Cardiac risk factors include family history only.  She has not taken anything for her symptoms.  She states that the pain is moderate to severe.  It is worsened with movement and palpation.  Additionally, she states that she has an autistic child that she has to help around the house, but denies any known MOI in doing so.  The history is provided by the patient. No language interpreter was used.    Past Medical History  Diagnosis Date  . Depression   . Anxiety   . H/O varicella   . History of measles, mumps, or rubella   . Blood transfusion   . H/O blood clots   . H/O bladder infections   . Diabetes mellitus   . Leaking of urine   . Preterm labor   . H/O dysmenorrhea 05/08/11  . Adenomyosis 05/08/11  . Pelvic pain 1/2/`13   Past Surgical History  Procedure Laterality Date  . Hernia repair    . Wisdom tooth extraction    . Cesarean section       x 3   Family History  Problem Relation Age of Onset  . Cancer Mother   . Diabetes Father   . Hypertension Father   . Asthma Sister    History  Substance Use Topics  . Smoking status: Never Smoker   . Smokeless tobacco: Never Used  . Alcohol Use: No   OB History   Grav Para Term Preterm Abortions TAB SAB Ect Mult Living   4 3 2 1 1  1   3       Review of Systems  All other systems reviewed and are negative.    Allergies  Review of patient's allergies indicates no known allergies.  Home Medications   Current Outpatient Rx  Name  Route  Sig  Dispense  Refill  . FLUoxetine (PROZAC) 20 MG capsule   Oral   Take 20 mg by mouth daily.         Marland Kitchen LORazepam (ATIVAN) 0.5 MG tablet   Oral   Take 0.5 mg by mouth 2 (two) times daily as needed. For anxiety         . Multiple Vitamin (MULTIVITAMIN WITH MINERALS) TABS   Oral   Take 1 tablet by mouth daily.          BP 128/83  Pulse 73  Temp(Src) 98.5 F (36.9 C) (Oral)  Resp 18  SpO2 97% Physical Exam  Nursing note and vitals reviewed. Constitutional: She is oriented to person, place, and time. She appears well-developed and well-nourished.  HENT:  Head: Normocephalic and atraumatic.  Eyes: Conjunctivae and EOM are normal. Pupils are equal, round, and reactive to light.  Neck: Normal range of motion. Neck supple.  Cardiovascular: Normal rate  and regular rhythm.  Exam reveals no gallop and no friction rub.   No murmur heard. Pulmonary/Chest: Effort normal and breath sounds normal. No respiratory distress. She has no wheezes. She has no rales. She exhibits tenderness.  Left chest wall tenderness  Abdominal: Soft. She exhibits no distension and no mass. There is no tenderness. There is no rebound and no guarding.  Musculoskeletal: Normal range of motion. She exhibits no edema and no tenderness.  Reproducible pain with chest flexion  Neurological: She is alert and oriented to person, place, and time.  Skin: Skin is warm and dry.  Psychiatric: She has a normal mood and affect. Her behavior is normal. Judgment and thought content normal.    ED Course  Procedures (including critical care time) Results for orders placed during the hospital encounter of 03/22/13  CBC      Result Value Range   WBC 5.4  4.0 - 10.5 K/uL   RBC 4.85  3.87 - 5.11 MIL/uL   Hemoglobin 14.6   12.0 - 15.0 g/dL   HCT 16.1  09.6 - 04.5 %   MCV 88.7  78.0 - 100.0 fL   MCH 30.1  26.0 - 34.0 pg   MCHC 34.0  30.0 - 36.0 g/dL   RDW 40.9  81.1 - 91.4 %   Platelets 275  150 - 400 K/uL  BASIC METABOLIC PANEL      Result Value Range   Sodium 141  135 - 145 mEq/L   Potassium 3.4 (*) 3.5 - 5.1 mEq/L   Chloride 105  96 - 112 mEq/L   CO2 27  19 - 32 mEq/L   Glucose, Bld 105 (*) 70 - 99 mg/dL   BUN 11  6 - 23 mg/dL   Creatinine, Ser 7.82  0.50 - 1.10 mg/dL   Calcium 9.9  8.4 - 95.6 mg/dL   GFR calc non Af Amer >90  >90 mL/min   GFR calc Af Amer >90  >90 mL/min  POCT I-STAT TROPONIN I      Result Value Range   Troponin i, poc 0.00  0.00 - 0.08 ng/mL   Comment 3            Dg Chest 2 View  03/22/2013   CLINICAL DATA:  Shortness of breath and left-sided chest pain.  EXAM: CHEST  2 VIEW  COMPARISON:  CT chest and chest radiograph 03/01/2013.  FINDINGS: Trachea is midline. Heart size normal. Lungs are clear. No pleural fluid.  IMPRESSION: No acute findings.   Electronically Signed   By: Leanna Battles M.D.   On: 03/22/2013 11:16   Dg Chest 2 View  03/01/2013   CLINICAL DATA:  Palpitations. Fatigue.  EXAM: CHEST  2 VIEW  COMPARISON:  None.  FINDINGS: The heart size and mediastinal contours are within normal limits. Both lungs are clear. The visualized skeletal structures are unremarkable.  IMPRESSION: No active cardiopulmonary disease.   Electronically Signed   By: Geanie Cooley M.D.   On: 03/01/2013 13:29   Ct Angio Chest Pe W/cm &/or Wo Cm  03/01/2013   CLINICAL DATA:  Intermittent palpitations, heart pounding, weakness, body aches, chills, nausea, lightheadedness, elevated D-dimer history of DVT x 2, diabetes  EXAM: CT ANGIOGRAPHY CHEST WITH CONTRAST  TECHNIQUE: Multidetector CT imaging of the chest was performed using the standard protocol during bolus administration of intravenous contrast. Multiplanar CT image reconstructions including MIPs were obtained to evaluate the vascular anatomy.   CONTRAST:  80mL OMNIPAQUE IOHEXOL 350 MG/ML  SOLN  COMPARISON:  None  FINDINGS: Aorta normal caliber without aneurysm or dissection.  Visualized portion of upper abdomen normal appearance.  Pulmonary arteries patent.  No evidence pulmonary embolism.  Central pulmonary arteries upper normal size.  Dependent atelectasis in the posterior lungs.  No definite pulmonary infiltrate, pleural effusion or pneumothorax.  No acute osseous findings.  Review of the MIP images confirms the above findings.  IMPRESSION: No evidence of pulmonary embolism.  Minimal dependent atelectasis in both lungs.   Electronically Signed   By: Ulyses Southward M.D.   On: 03/01/2013 18:13    Imaging Review Dg Chest 2 View  03/22/2013   CLINICAL DATA:  Shortness of breath and left-sided chest pain.  EXAM: CHEST  2 VIEW  COMPARISON:  CT chest and chest radiograph 03/01/2013.  FINDINGS: Trachea is midline. Heart size normal. Lungs are clear. No pleural fluid.  IMPRESSION: No acute findings.   Electronically Signed   By: Leanna Battles M.D.   On: 03/22/2013 11:16    EKG Interpretation   None       MDM   1. Chest pain with minimal risk of acute coronary syndrome   2. Anxiety and depression   3. ASD (atrial septal defect)   4. Dyspnea   5. Palpitations     Patient with CP and SOB.  Abs and chest x-ray are reassuring. She had a recent workup for PE 3 weeks ago, and had a negative CT angio. She is currently being seen by cardiology, and has been wearing a Holter monitor. She has an echo scheduled for later this week.  I suspect that the patient's symptoms are musculoskeletal, and have discussed this with Dr. Karma Ganja.  However, as the patient is in the middle of a cardiac workup by Dr. Rubie Maid, I will consult cardiology.  Patient to be admitted by cardiology.  5:45 PM Patient roomed at 34 and seen at 1540.   Roxy Horseman, PA-C 03/22/13 1746

## 2013-03-23 ENCOUNTER — Encounter (HOSPITAL_COMMUNITY): Payer: Self-pay | Admitting: General Practice

## 2013-03-23 DIAGNOSIS — R55 Syncope and collapse: Secondary | ICD-10-CM

## 2013-03-23 DIAGNOSIS — I471 Supraventricular tachycardia: Secondary | ICD-10-CM

## 2013-03-23 DIAGNOSIS — I519 Heart disease, unspecified: Secondary | ICD-10-CM

## 2013-03-23 LAB — BASIC METABOLIC PANEL
BUN: 15 mg/dL (ref 6–23)
CO2: 23 mEq/L (ref 19–32)
GFR calc non Af Amer: >90 mL/min (ref >90)
Glucose, Bld: 139 mg/dL — ABNORMAL HIGH (ref 70–99)
Potassium: 3.6 mEq/L (ref 3.5–5.1)

## 2013-03-23 LAB — TROPONIN I: Troponin I: <0.3 ng/mL (ref <0.30)

## 2013-03-23 LAB — GLUCOSE, CAPILLARY

## 2013-03-23 MED ORDER — NAPROXEN 375 MG PO TABS: 375.0000 mg | ORAL_TABLET | Freq: Two times a day (BID) | ORAL | Status: DC

## 2013-03-23 MED ORDER — PANTOPRAZOLE SODIUM 40 MG PO TBEC: 40.0000 mg | DELAYED_RELEASE_TABLET | Freq: Every day | ORAL | Status: DC

## 2013-03-23 MED ORDER — INFLUENZA VAC SPLIT QUAD 0.5 ML IM SUSP
0.5000 mL | INTRAMUSCULAR | Status: AC
  Administered 2013-03-24: 0.5 mL via INTRAMUSCULAR
  Filled 2013-03-23: qty 0.5

## 2013-03-23 MED ORDER — SODIUM CHLORIDE 0.9 % IV SOLN
INTRAVENOUS | Status: AC
  Administered 2013-03-23: 17:00:00 via INTRAVENOUS

## 2013-03-23 MED ORDER — TRAMADOL HCL 50 MG PO TABS: 50.0000 mg | ORAL_TABLET | Freq: Four times a day (QID) | ORAL | Status: DC | PRN

## 2013-03-23 MED ORDER — SODIUM CHLORIDE 0.9 % IV BOLUS (SEPSIS)
1000.0000 mL | Freq: Once | INTRAVENOUS | Status: AC
  Administered 2013-03-23: 1000 mL via INTRAVENOUS

## 2013-03-23 NOTE — Consult Note (Signed)
ELECTROPHYSIOLOGY CONSULT NOTE    Patient ID: Jane Munoz MRN: [id number], DOB/AGE: Dec 26, 1970 42 y.o.  Admit date: 03/22/2013 Date of Consult: 03-23-2013  Primary Cardiologist: Dr Royann Shivers  Reason for Consultation: syncope, tachycardia  HPI:  Jane Munoz is a 42 year old female with a past medical history notable for depression/anxiety.  She has been undergoing work up by Dr C for palpitations and shortness of breath.  She was admitted yesterday for chest pain.  She has had negative troponins and underwent CT scanning for evaluation of PE due to elevated D Dimer which was also negative.  Plans were for discharge this afternoon.  She was ambulating in the hallway, became dizzy and developed tachypalpitations.   Her sinus rate increased from 80bpm to 150bpm.  She became weak and per report had some loss of consciousness.  She was assisted back to the bed and regained her composure slowly.  Telemetry demonstrated sinus tachycardia with this event.  Orthostatics were positive with a 40 point increase jn heart rate from lying to sitting.   Past Medical History  Diagnosis Date  . Depression   . Anxiety   . H/O varicella   . History of measles, mumps, or rubella   . Blood transfusion   . H/O blood clots   . H/O bladder infections   . Leaking of urine   . Preterm labor   . H/O dysmenorrhea 05/08/11  . Adenomyosis 05/08/11  . Pelvic pain 1/2/`13     Surgical History:  Past Surgical History  Procedure Laterality Date  . Hernia repair    . Wisdom tooth extraction    . Cesarean section       x 3     Prescriptions prior to admission  Medication Sig Dispense Refill  . FLUoxetine (PROZAC) 20 MG capsule Take 20 mg by mouth daily.      Marland Kitchen LORazepam (ATIVAN) 0.5 MG tablet Take 0.5 mg by mouth 2 (two) times daily as needed. For anxiety      . Multiple Vitamin (MULTIVITAMIN WITH MINERALS) TABS Take 1 tablet by mouth daily.        Inpatient Medications:  . [START ON 03/24/2013]  influenza vac split quadrivalent PF  0.5 mL Intramuscular Tomorrow-1000  . naproxen  375 mg Oral BID WC  . pantoprazole  40 mg Oral Q0600    Allergies: No Known Allergies  History   Social History  . Marital Status: Divorced    Spouse Name: N/A    Number of Children: N/A  . Years of Education: N/A   Occupational History  . Not on file.   Social History Main Topics  . Smoking status: Never Smoker   . Smokeless tobacco: Never Used  . Alcohol Use: No  . Drug Use: No  . Sexual Activity: Not Currently   Other Topics Concern  . Not on file   Social History Narrative  . No narrative on file     Family History  Problem Relation Age of Onset  . Cancer Mother   . Diabetes Father   . Hypertension Father   . Asthma Sister     Physical Exam: Filed Vitals:   03/22/13 2050 03/23/13 0612 03/23/13 1304 03/23/13 1347  BP: 130/66 108/63 105/65 112/80  Pulse: 60 60 79 67  Temp: 98 F (36.7 C) 98.1 F (36.7 C)  97.8 F (36.6 C)  TempSrc:  Oral    Resp: 18 18  16   SpO2: 100% 100%  99%  GEN- The patient is overweight appearing, alert and oriented x 3 today.   Head- normocephalic, atraumatic Eyes-  Sclera clear, conjunctiva pink Ears- hearing intact Oropharynx- clear with dry MM Neck- supple with flat JVP Lungs- Clear to ausculation bilaterally, normal work of breathing Heart- Regular rate and rhythm, no murmurs, rubs or gallops, PMI not laterally displaced GI- soft, NT, ND, + BS Extremities- no clubbing, cyanosis, or edema MS- no significant deformity or atrophy Skin- no rash or lesion Psych- anxious appearing Neuro- strength and sensation are intact  Labs:   Lab Results  Component Value Date   WBC 5.4 03/22/2013   HGB 14.6 03/22/2013   HCT 43.0 03/22/2013   MCV 88.7 03/22/2013   PLT 275 03/22/2013    Recent Labs Lab 03/23/13 1330  NA 138  K 3.6  CL 105  CO2 23  BUN 15  CREATININE 0.65  CALCIUM 9.0  GLUCOSE 139*   Lab Results  Component Value Date    TROPONINI <0.30 03/22/2013    Radiology/Studies: Dg Chest 2 View  03/22/2013   CLINICAL DATA:  Shortness of breath and left-sided chest pain.  EXAM: CHEST  2 VIEW  COMPARISON:  CT chest and chest radiograph 03/01/2013.  FINDINGS: Trachea is midline. Heart size normal. Lungs are clear. No pleural fluid.  IMPRESSION: No acute findings.   Electronically Signed   By: Leanna Battles M.D.   On: 03/22/2013 11:16   Dg Chest 2 View  03/01/2013   CLINICAL DATA:  Palpitations. Fatigue.  EXAM: CHEST  2 VIEW  COMPARISON:  None.  FINDINGS: The heart size and mediastinal contours are within normal limits. Both lungs are clear. The visualized skeletal structures are unremarkable.  IMPRESSION: No active cardiopulmonary disease.   Electronically Signed   By: Geanie Cooley M.D.   On: 03/01/2013 13:29   Ct Angio Chest Pe W/cm &/or Wo Cm  03/01/2013   CLINICAL DATA:  Intermittent palpitations, heart pounding, weakness, body aches, chills, nausea, lightheadedness, elevated D-dimer history of DVT x 2, diabetes  EXAM: CT ANGIOGRAPHY CHEST WITH CONTRAST  TECHNIQUE: Multidetector CT imaging of the chest was performed using the standard protocol during bolus administration of intravenous contrast. Multiplanar CT image reconstructions including MIPs were obtained to evaluate the vascular anatomy.  CONTRAST:  80mL OMNIPAQUE IOHEXOL 350 MG/ML SOLN  COMPARISON:  None  FINDINGS: Aorta normal caliber without aneurysm or dissection.  Visualized portion of upper abdomen normal appearance.  Pulmonary arteries patent.  No evidence pulmonary embolism.  Central pulmonary arteries upper normal size.  Dependent atelectasis in the posterior lungs.  No definite pulmonary infiltrate, pleural effusion or pneumothorax.  No acute osseous findings.  Review of the MIP images confirms the above findings.  IMPRESSION: No evidence of pulmonary embolism.  Minimal dependent atelectasis in both lungs.   Electronically Signed   By: Ulyses Southward M.D.   On:  03/01/2013 18:13    EKG: 03/22/13 reveals sinus rhythm 82 bpm, PR 164, QRS 78, Qtc 418 Echo is reviewed Dr Hazle Coca notes are reviewed  TELEMETRY: sinus rhythm with sinus tachycardia during the episode described above, no other arrhythmias  A/P  1. Sinus tachycardia with presyncope The patient stood today and developed orthostatic intolerance with ambulation in the hall.  Her telemetry during this episode revealed only sinus tach.  Upon my review, she is profoundly orthostatic by heart rate.  She appears dry on exam. I would recommend IV hydration at this time.  Increase oral hydration and liberalize salt at home.  I would recommend TSH (ordered). I do not feel that further EP workup is planned. OK to discharge once no longer orthostatic. I will see as needed while here.

## 2013-03-23 NOTE — Progress Notes (Signed)
I began to walk the patient to prepare for discharge and she walked approximately 50 feet then began having a hard time keeping her eyes open. When I asked her if she was feeling ok, she collapsed into my arms. I was already bracing her with my right arm prior to collapse. She did not hit the floor as I was quickly assisted by Nada Boozer, who is actively seeing the patient. Patients heart rate at the time was 115 sinus tach on the monitor but was previously recorded to be in the 140s when she started to walk. Pt rolled back to room in chair. She was always breathing, responsive to a sternal rub. When we got her back to bed, vital sign 107/56, sinus rhythm int he 80s. )@ 100% on RA, cbg WNL. resp rate 16. Pt talking but not very much. Patient safe in bed with bed alarm on. Plan to cancel discharge and as planned by MD. Will closely monitor

## 2013-03-23 NOTE — Progress Notes (Signed)
         Subjective: Sleepy, but no specific complaints, just back from echo  Objective: Vital signs in last 24 hours: Temp:  [97.7 F (36.5 C)-98.5 F (36.9 C)] 98.1 F (36.7 C) (11/18 0612) Pulse Rate:  [57-73] 60 (11/18 0612) Resp:  [12-26] 18 (11/18 0612) BP: (108-134)/(63-95) 108/63 mmHg (11/18 0612) SpO2:  [97 %-100 %] 100 % (11/18 0612) Weight change:  Last BM Date: 03/22/13 Intake/Output from previous day:   Intake/Output this shift: Total I/O In: 360 [P.O.:360] Out: -   PE: General:Pleasant affect, NAD Skin:Warm and dry, brisk capillary refill HEENT:normocephalic, sclera clear, mucus membranes moist Heart:S1S2 RRR without murmur, gallup, rub or click Lungs:clear without rales, rhonchi, or wheezes WGN:FAOZ, non tender, + BS, do not palpate liver spleen or masses Ext:no lower ext edema, 2+ pedal pulses, 2+ radial pulses Neuro:alert and oriented, MAE, follows commands, + facial symmetry   Lab Results:  Recent Labs  03/22/13 1044  WBC 5.4  HGB 14.6  HCT 43.0  PLT 275   BMET  Recent Labs  03/22/13 1044  NA 141  K 3.4*  CL 105  CO2 27  GLUCOSE 105*  BUN 11  CREATININE 0.53  CALCIUM 9.9    Recent Labs  03/22/13 1913 03/22/13 2322  TROPONINI <0.30 <0.30    No results found for this basename: CHOL, HDL, LDLCALC, LDLDIRECT, TRIG, CHOLHDL   No results found for this basename: HGBA1C     Lab Results  Component Value Date   TSH 1.520 10/14/2011      Studies/Results: Dg Chest 2 View  03/22/2013   CLINICAL DATA:  Shortness of breath and left-sided chest pain.  EXAM: CHEST  2 VIEW  COMPARISON:  CT chest and chest radiograph 03/01/2013.  FINDINGS: Trachea is midline. Heart size normal. Lungs are clear. No pleural fluid.  IMPRESSION: No acute findings.   Electronically Signed   By: Leanna Battles M.D.   On: 03/22/2013 11:16    Medications: I have reviewed the patient's current medications. Scheduled Meds: . [START ON 03/24/2013] influenza  vac split quadrivalent PF  0.5 mL Intramuscular Tomorrow-1000  . naproxen  375 mg Oral BID WC  . pantoprazole  40 mg Oral Q0600   Continuous Infusions:  PRN Meds:.acetaminophen, ALPRAZolam, traMADol, zolpidem  Assessment/Plan: Principal Problem:   Chest pain with minimal risk of acute coronary syndrome Active Problems:   Menorrhagia   Dysmenorrhea   IUD contraception-inserted 11/18/11   ASD (atrial septal defect)-? by history when she was pregnanat   Palpitations   Elevated d-dimer- 1.29 on Oct 27th 2014- CTA neg for PE, LE venous dopplers neg for DVT 03/10/13   Dyspnea   Anxiety and depression  PLAN: if Echo normal may discharge, recheck BMP, neg. MI,   LOS: 1 day   Time spent with pt. :15 minutes. [facility] R  Nurse Practitioner Certified Pager [phone] 03/23/2013, 10:03 AM   Agree with note written by Nada Boozer RNP  NSR. Enz neg. Exam benign. Doubt cardiac CP. 2D done. Looking for PFO/ASD. OK for D/C home after that with event monitor which she is wearing. ROV with Dr. Julieanne Cotton J 03/23/2013 11:10 AM

## 2013-03-23 NOTE — Progress Notes (Signed)
Bolus completed and pt needed to go to the bathroom. She was complaining of the dizziness not getting better. I checked her blood pressure 120/64 lying in bed. BP sitting on side of bed 114/58. Heart rate initially went up to 109 ST but quickly came down to 75 NSR. I got her to [facility] and patient still very unsteady on feet. I notified Vernona Rieger, orders received. Will cont to monitor

## 2013-03-23 NOTE — Progress Notes (Signed)
Echocardiogram 2D Echocardiogram has been performed.  Dorothey Baseman 03/23/2013, 10:28 AM

## 2013-03-23 NOTE — Progress Notes (Signed)
Pt was preparing for discharge and while walking in hall with assistance she felt weak, leaning more on RN then seemed to collapse, and would not respond verbally, though she was breathing and HR jumped to 150.  STach.  Her HR went form 66' to 120 with walking then jumped to 150.  She responded to sternal rub by yelling out.  We took in chair to room and her HR slowed once we had her flat. BP and glucose stable.  Discussed with Dr. Allyson Sabal will obtain EP consult and cancel discharge.

## 2013-03-23 NOTE — Progress Notes (Signed)
    Pt was to be discharged but had near syncope episode, she was kept and hydrated but symptoms continue.

## 2013-03-23 NOTE — Progress Notes (Signed)
UR completed 

## 2013-03-24 DIAGNOSIS — I498 Other specified cardiac arrhythmias: Secondary | ICD-10-CM

## 2013-03-24 DIAGNOSIS — R5381 Other malaise: Secondary | ICD-10-CM

## 2013-03-24 DIAGNOSIS — M6281 Muscle weakness (generalized): Secondary | ICD-10-CM

## 2013-03-24 LAB — GLUCOSE, CAPILLARY: Glucose-Capillary: 88 mg/dL (ref 70–99)

## 2013-03-24 LAB — BASIC METABOLIC PANEL
BUN: 12 mg/dL (ref 6–23)
CO2: 23 mEq/L (ref 19–32)
Chloride: 109 mEq/L (ref 96–112)
GFR calc Af Amer: >90 mL/min (ref >90)
GFR calc non Af Amer: >90 mL/min (ref >90)
Potassium: 5.3 mEq/L — ABNORMAL HIGH (ref 3.5–5.1)

## 2013-03-24 MED ORDER — ENOXAPARIN SODIUM 40 MG/0.4ML ~~LOC~~ SOLN
40.0000 mg | SUBCUTANEOUS | Status: DC
  Administered 2013-03-25: 40 mg via SUBCUTANEOUS
  Filled 2013-03-24 (×2): qty 0.4

## 2013-03-24 NOTE — Progress Notes (Signed)
Clinical Social Work Department BRIEF PSYCHOSOCIAL ASSESSMENT 03/24/2013  Patient:  Jane Munoz, Jane Munoz     Account Number:  [id number]     Admit date:  03/22/2013  Clinical Social Worker:  Harless Nakayama  Date/Time:  03/24/2013 04:00 PM  Referred by:  Physician  Date Referred:  03/24/2013 Referred for  Psychosocial assessment   Other Referral:   Interview type:  Patient Other interview type:    PSYCHOSOCIAL DATA Living Status:  WITH MINOR CHILDREN Admitted from facility:   Level of care:   Primary support name:  Jane Munoz [phone] Primary support relationship to patient:  FRIEND Degree of support available:   Pt has supportive friends that reside here in [place] and supportive family that are out of state    CURRENT CONCERNS Current Concerns  Other - See comment   Other Concerns:   Concerns for care of pt children    SOCIAL WORK ASSESSMENT / PLAN CSW was contacted by pt daughter's school social worker Jane Munoz and counselor Jane Munoz (school phone [phone]). CSW was informed that pt has two daughters age 44 and 84. Pt's older daughter is totally dependent on pt because of severe cognitive impairments and physical limitations. Ms. [name] informed CSW they have called pt and spoke to pt but unable to fully communicate as pt seemed very weak or sedated over the phone. The school is trying to inquire about whether a CPS report needs to be made. Pt daughters are currently being cared for but will have no plan of care after Friday. CSW unable to release any information during this conversation to the school as no release obtained from pt.    CSW visited pt to speak with about above concerns. When CSW entered room, pt was in a semi-seated position and had cell phone in her hands and was using in some capacity. CSW spoke with pt and inquired about home life. Pt is currently working on her PHD and believes she has about 2 years left. Pt has supportive friend, Pensions consultant. CSW informed pt about school's concerns. Pt informed CSW her sister Jane Munoz [phone]) was making plans to fly in from [place] tomorrow. (CSW called and left message with this number to confirm). Pt also said her 67 year old son Jane Munoz [phone]) is a senior in high school and will be visiting after school on Friday. Plan is for pt's daughters to stay with aunt in pt's home until pt is able to return home. If for any reason this is not possible, pt's friend Jane Munoz will care for children. CSW did confirm this with [facility] CSW asked pt if she would be okay with information being released to school personel as they were inquiring about pt conditin. Pt signed authorization of release of information to Ms. [name].    CSW called Ms. [name] and spoke on speaker phone with Ms. [name] and school principal Jane Munoz [phone]) and infomred of pt plan of care for children. Did also inform that during conversation pt did state she would be okay with Jane Munoz being appointed as Guardian for children. CSW to follow up on this if necessary.      CSW became aware of PT recommendation for SNF after conversation with pt. Will assess pt for SNF and refer out tomorrow with updated MD notes.   Assessment/plan status:  Psychosocial Support/Ongoing Assessment of Needs Other assessment/ plan:   Information/referral to community resources:   None right now    PATIENT'S/FAMILY'S RESPONSE TO PLAN OF CARE: No  plan of care established at this time.       Jane Munoz, LCSWA [phone]

## 2013-03-24 NOTE — Progress Notes (Addendum)
Subjective: Extremely weak.  Objective: Vital signs in last 24 hours: Temp:  [97.8 F (36.6 C)-98.2 F (36.8 C)] 98.2 F (36.8 C) (11/19 0600) Pulse Rate:  [57-79] 63 (11/19 0802) Resp:  [16-18] 18 (11/19 0600) BP: (105-135)/(58-80) 135/65 mmHg (11/19 0802) SpO2:  [99 %-100 %] 100 % (11/19 0600) Last BM Date: 03/23/13  Intake/Output from previous day: 11/18 0701 - 11/19 0700 In: 960 [P.O.:960] Out: -  Intake/Output this shift:    Medications Current Facility-Administered Medications  Medication Dose Route Frequency Provider Last Rate Last Dose  . acetaminophen (TYLENOL) tablet 650 mg  650 mg Oral Q4H PRN Abelino Derrick, PA-C      . ALPRAZolam Prudy Feeler) tablet 0.25 mg  0.25 mg Oral TID PRN Abelino Derrick, PA-C   0.25 mg at 03/22/13 2147  . influenza vac split quadrivalent PF (FLUARIX) injection 0.5 mL  0.5 mL Intramuscular Tomorrow-1000 Runell Gess, MD      . naproxen (NAPROSYN) tablet 375 mg  375 mg Oral BID WC Abelino Derrick, PA-C   375 mg at 03/24/13 1027  . pantoprazole (PROTONIX) EC tablet 40 mg  40 mg Oral Q0600 Abelino Derrick, PA-C   40 mg at 03/24/13 0517  . traMADol (ULTRAM) tablet 50 mg  50 mg Oral Q6H PRN Abelino Derrick, PA-C      . zolpidem (AMBIEN) tablet 5 mg  5 mg Oral QHS PRN Runell Gess, MD        PE: General appearance: alert, cooperative and no distress Lungs: clear to auscultation bilaterally Heart: regular rate and rhythm, S1, S2 normal, no murmur, click, rub or gallop Extremities: No LEE Pulses: 2+ and symmetric Skin: Warm and dry Neurologic: Grossly normal  Lab Results:   Recent Labs  03/22/13 1044  WBC 5.4  HGB 14.6  HCT 43.0  PLT 275   BMET  Recent Labs  03/22/13 1044 03/23/13 1330 03/24/13 0550  NA 141 138 139  K 3.4* 3.6 5.3*  CL 105 105 109  CO2 27 23 23   GLUCOSE 105* 139* 85  BUN 11 15 12   CREATININE 0.53 0.65 0.57  CALCIUM 9.9 9.0 8.5     Assessment/Plan  Principal Problem:   Chest pain with minimal risk  of acute coronary syndrome Active Problems:   Menorrhagia   Dysmenorrhea   IUD contraception-inserted 11/18/11   ASD (atrial septal defect)-? by history when she was pregnanat   Palpitations   Elevated d-dimer- 1.29 on Oct 27th 2014- CTA neg for PE, LE venous dopplers neg for DVT 03/10/13   Dyspnea   Anxiety and depression  Plan:  Orthostatic yesterday.  The patient can not even sit up in the bed or close her hand.  Just checked her blood sugar which is 88.  HR is stable.   No obvious etiology for her profound weakness.  May be psychosomatic.    LOS: 2 days    HAGER, BRYAN 03/24/2013 9:59 AM  I have seen and evaluated the patient this AM along with Wilburt Finlay, PA. I agree with his findings, examination as well as impression recommendations.  I am having a hard time figuring out what has happened -- she was ready for d/c yesterday, but then collapsed while walking -- has brief episode of Stachy - not felt to be related to arrhythmia by EP. => Orthostatis was the thought.  But she was otherwise stable.   This AM, she simply lies there.  Profoundly weak - hand  grip ~1-2+, could not keep herself sitting up in bed.  Delayed EOM.  She moves all 4 Ext & has no focal neuro findings to suggest CVA.  I will consult Neurology & TRH to assist with her care -- He main issue is no longer Cardioliogy related. I also am not convinced that there could be some Secondary Gain involved.  BP & HR are stable. Have also consulted PT for evaluation.  May consider Midodrine for orthostasis, but that is not the main concern now.    Marykay Lex, M.D., M.S. [facility] [address] [place], [place]  [address]  [phone] Pager # [phone] 03/24/2013 11:43 AM

## 2013-03-24 NOTE — Consult Note (Signed)
[facility] Medical Consultation  Jane Munoz MVH:[id number] DOB: Nov 27, 1970 DOA: 03/22/2013 PCP: Pcp Not In System   Requesting physician: Herbie Baltimore Date of consultation: 03/24/2013 Reason for consultation: weakness  Impression/Recommendations Principal Problem:   Chest pain with minimal risk of acute coronary syndrome Active Problems:   Menorrhagia   Dysmenorrhea   IUD contraception-inserted 11/18/11   ASD (atrial septal defect)-? by history when she was pregnanat   Palpitations   Elevated d-dimer- 1.29 on Oct 27th 2014- CTA neg for PE, LE venous dopplers neg for DVT 03/10/13   Dyspnea   Anxiety and depression  1. Weakness. I am not entirely sure what would cause such profound weakness of sudden onset. I do not appreciate any focal deficits. What is unusual is that she also has weakness when she talks stating that her "mouth is weak". Her neurologic exam on my evaluation as well as neurology evaluation is inconsistent and has effort dependent strength. Given orthostatic, I am obtaining a cortisol for completeness. TSH was within normal limits. Recommend PT/OT evaluation. I do not see an overt medical cause for her weakness. Noted mild hyperkalemia, however the sample was hemolyzed. - repeat orthostatics now that she has been hydrated  I will followup again tomorrow. Please contact me if I can be of assistance in the meanwhile. Thank you for this consultation.  Chief Complaint: weakness  HPI:  Patient admitted on the cardiology service for chest pain and elevated d-dimer. CT angiography was negative for PE. She was supposed to be discharged yesterday per cardiology, however upon ability in the hallway, patient is complaining of sudden onset weakness and had a presyncopal event. She was also orthostatic at that time per heart rate.There was no loss of consciousness or falls. Patient still feels poorly today, complaining of generalized weakness, and medicine has been asked for  consultation. She started to complain of generalized weakness. She denies any fevers. She denies any chest pain, nausea or vomiting. She denies any lightheadedness or dizziness. She never had symptoms like this in the past. 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 She was supposed to have that done last year however her research did not work as expected. She denies any cough, denies any dysuria or burning with urination.  Review of Systems:  As per history of present illness, otherwise negative  Past Medical History  Diagnosis Date  . Depression   . Anxiety   . H/O varicella   . History of measles, mumps, or rubella   . Blood transfusion   . H/O blood clots   . H/O bladder infections   . Leaking of urine   . Preterm labor   . H/O dysmenorrhea 05/08/11  . Adenomyosis 05/08/11  . Pelvic pain 1/2/`13   Past Surgical History  Procedure Laterality Date  . Hernia repair    . Wisdom tooth extraction    . Cesarean section       x 3   Social History:  reports that she has never smoked. She has never used smokeless tobacco. She reports that she does not drink alcohol or use illicit drugs.  No Known Allergies Family History  Problem Relation Age of Onset  . Cancer Mother   . Diabetes Father   . Hypertension Father   . Asthma Sister     Prior to Admission medications   Medication Sig Start Date End Date Taking? Authorizing Provider  FLUoxetine (PROZAC)  20 MG capsule Take 20 mg by mouth daily.   Yes Historical Provider, MD  LORazepam (ATIVAN) 0.5 MG tablet Take 0.5 mg by mouth 2 (two) times daily as needed. For anxiety   Yes Historical Provider, MD  Multiple Vitamin (MULTIVITAMIN WITH MINERALS) TABS Take 1 tablet by mouth daily.   Yes Historical Provider, MD  naproxen (NAPROSYN) 375 MG tablet Take 1 tablet (375 mg total) by mouth 2 (two) times daily with a meal. 03/23/13   Nada Boozer, NP  pantoprazole  (PROTONIX) 40 MG tablet Take 1 tablet (40 mg total) by mouth daily at 6 (six) AM. 03/23/13   Nada Boozer, NP  traMADol (ULTRAM) 50 MG tablet Take 1 tablet (50 mg total) by mouth every 6 (six) hours as needed for moderate pain. 03/23/13   Nada Boozer, NP   Physical Exam: Blood pressure 135/65, pulse 63, temperature 98.2 F (36.8 C), temperature source Oral, resp. rate 18, SpO2 100.00%. Filed Vitals:   03/24/13 0802  BP: 135/65  Pulse: 63  Temp:   Resp:      General:  NAD, barely talking  ENT: moist oropharynx  Neck: no JVD  Cardiovascular: RRR  Respiratory: CTA biL  Abdomen: soft, NTTP  Musculoskeletal: no edema  Psychiatric: flat affect  Neurologic: non focal, not giving full strength for exam  Labs on Admission:  Basic Metabolic Panel:  Recent Labs Lab 03/22/13 1044 03/23/13 1330 03/24/13 0550  NA 141 138 139  K 3.4* 3.6 5.3*  CL 105 105 109  CO2 27 23 23   GLUCOSE 105* 139* 85  BUN 11 15 12   CREATININE 0.53 0.65 0.57  CALCIUM 9.9 9.0 8.5   CBC:  Recent Labs Lab 03/22/13 1044  WBC 5.4  HGB 14.6  HCT 43.0  MCV 88.7  PLT 275   Cardiac Enzymes:  Recent Labs Lab 03/22/13 1913 03/22/13 2322  TROPONINI <0.30 <0.30   CBG:  Recent Labs Lab 03/23/13 1300 03/24/13 1009  GLUCAP 116* 88   Time spent: 50  Clearance Chenault [facility] Pager [phone]  If 7PM-7AM, please contact night-coverage [email or web address] Password [email or web address] 03/24/2013, 1:01 PM

## 2013-03-24 NOTE — Progress Notes (Signed)
Chaplain offered emotional support to patient. Patient said she is from [place], with two adult daughters. Patient expressed concern and anxiety over " not knowing wha'ts wrong with me." Patient was teary but hesitant to discuss further. Chaplain provided emotional support, emphatic listening and caring presence. Michael Boston participated in this visit.  Maurene Capes, [place] [phone]

## 2013-03-24 NOTE — Evaluation (Signed)
Physical Therapy Evaluation Patient Details Name: Jane Munoz MRN: [id number] DOB: [birth year] Today's Date: 03/24/2013 Time: 1430-1500 PT Time Calculation (min): 30 min  PT Assessment / Plan / Recommendation History of Present Illness  Admitted with chest pain.  Clinical Impression  Pt demo's inconsistent functional presentation with exam findings. Pt currently unsafe to d/c home due to pt unable to care for self and is at significant falls risk.Pt requiring assist for all mobility and ADLs. Pt to benefit from ST-SNF placement to address mentioned deficits and achieve safe mod I function for safe transition home.     PT Assessment  Patient needs continued PT services    Follow Up Recommendations  SNF;Supervision/Assistance - 24 hour    Does the patient have the potential to tolerate intense rehabilitation      Barriers to Discharge Decreased caregiver support has 44 yo and 16 yo, one child with special needs    Equipment Recommendations   (TBD)    Recommendations for Other Services     Frequency Min 3X/week    Precautions / Restrictions Precautions Precautions: Fall Restrictions Weight Bearing Restrictions: No   Pertinent Vitals/Pain Reports abdominal cramps      Mobility  Bed Mobility Bed Mobility: Supine to Sit;Sit to Supine Supine to Sit: 3: Mod assist Sit to Supine: 5: Supervision Details for Bed Mobility Assistance: assist for trunk elevation, max directional v/c's. increased time to bring LEs to EOB. when pt asked why she is having a hard time moving LE pt reports "i can bear my body weight".  Transfers Transfers: Sit to Stand;Stand to Sit Sit to Stand: 1: +2 Total assist;With upper extremity assist;From bed Sit to Stand: Patient Percentage: 50% Stand to Sit: 1: +2 Total assist;With upper extremity assist (pt with uncontrolled descent onto EOB) Stand to Sit: Patient Percentage: 10% Details for Transfer Assistance: pt was able to pull self up in bed using  UEs and use of bedrail however did not use UEs functionally to assist with pushing up into standing. s/p v/c's pt did hold onto PT and RNs hands to achieve full upright standing. Pt then experienced bilat knee buckling and had an uncontrolled descent back to EOB. Pt reports "My head just got dizzy." Ambulation/Gait Ambulation/Gait Assistance: Not tested (comment) (pt unsafe)    Exercises     PT Diagnosis: Generalized weakness;Difficulty walking  PT Problem List: Decreased strength;Decreased activity tolerance;Decreased balance;Decreased mobility PT Treatment Interventions: DME instruction;Gait training;Functional mobility training;Balance training;Therapeutic activities;Therapeutic exercise     PT Goals(Current goals can be found in the care plan section) Acute Rehab PT Goals Patient Stated Goal: to get better PT Goal Formulation: With patient Time For Goal Achievement: 04/07/13 Potential to Achieve Goals: Fair  Visit Information  Last PT Received On: 03/24/13 Assistance Needed: +2 History of Present Illness: Admitted with chest pain.       Prior Functioning  Home Living Family/patient expects to be discharged to:: Skilled nursing facility Additional Comments: pt lives at home with 2 kids 14, and 10. Daughter is special needs. 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 Pt lives in ground floor apartment Prior Function Level of Independence: Independent Comments: sister coming in from [place] tomorrow Communication Communication: No difficulties (soft spoken) Dominant Hand: Right    Cognition  Cognition Arousal/Alertness: Awake/alert Behavior During Therapy: Flat affect Overall Cognitive Status: Impaired/Different from baseline Area of Impairment: Safety/judgement;Awareness Safety/Judgement: Decreased awareness of safety Awareness: Intellectual General Comments: pt functional  presentation  inconsistent with pt report of symptoms and diagnostic testing, ie MMT    Extremity/Trunk Assessment Upper Extremity Assessment Upper Extremity Assessment: Overall WFL for tasks assessed. Pt pulled self up in bed however did not use them functionally for transfers to EOB or standing Lower Extremity Assessment Lower Extremity Assessment: Overall WFL for tasks assessed;Difficult to assess due to impaired cognition (pt unable to complete LAQ sitting EOB however able to lift legs back into bed  Cervical / Trunk Assessment Cervical / Trunk Assessment: Normal   Balance Balance Balance Assessed: Yes Static Sitting Balance Static Sitting - Balance Support: Feet supported Static Sitting - Level of Assistance: 4: Min assist Static Sitting - Comment/# of Minutes: max directional v/c's to use UE to support self. pt unable to maintain head in upright position Static Standing Balance Static Standing - Balance Support: Bilateral upper extremity supported Static Standing - Level of Assistance: 1: +2 Total assist Static Standing - Comment/# of Minutes: <30 sec  End of Session PT - End of Session Equipment Utilized During Treatment: Gait belt Activity Tolerance: Patient limited by fatigue;Patient limited by lethargy Patient left: in bed;with call bell/phone within reach;with bed alarm set Nurse Communication: Mobility status  GP Functional Assessment Tool Used: clinical judgement Functional Limitation: Mobility: Walking and moving around Mobility: Walking and Moving Around Current Status (A5409): At least 80 percent but less than 100 percent impaired, limited or restricted Mobility: Walking and Moving Around Goal Status [phone]): At least 1 percent but less than 20 percent impaired, limited or restricted   Marcene Brawn 03/24/2013, 3:15 PM  Lewis Shock, PT, DPT Pager #: [phone] Office #: [phone]

## 2013-03-24 NOTE — Progress Notes (Signed)
RN tired to obtain orthostatic vital signs this AM.  Able to obtain lying and sitting, but patient became too dizzy and was swaying while sitting, and became unsafe to get standing orthostatics.  Patient very weak and unable to even sit on side of bed unassisted.  Patient responds to verbal commands but did not want to try to stand for orthostatic vitals.  Will continue to monitor.  [place], Mitzi Hansen

## 2013-03-24 NOTE — Consult Note (Signed)
NEURO HOSPITALIST CONSULT NOTE    Reason for Consult: generalized weakness of sudden onset  HPI:                                                                                                                                          Jane Munoz is an 42 y.o. female who was initially brought to [facility] hospital due to CP and elevated D-dimer.  CTA was negative for PE.  EKG, CXR and Troponin were all normal. Patient was doing well and walking in hallways as of yesterday. Today she was about to be discharged but suddenly had episode of near syncope and complaints of generalized weakness. As per nursing note "she walked approximately 50 feet then began having a hard time keeping her eyes open. When I asked her if she was feeling ok, she collapsed into my arms. I was already bracing her with my right arm prior to collapse. She did not hit the floor. "  Currently patient is awake and oriented, stating she is weak, she is able to stand by herself and then allows herself to fall backwards,but will not allow herself to fall completely. No focal or lateralizing symptoms noted.    Past Medical History  Diagnosis Date  . Depression   . Anxiety   . H/O varicella   . History of measles, mumps, or rubella   . Blood transfusion   . H/O blood clots   . H/O bladder infections   . Leaking of urine   . Preterm labor   . H/O dysmenorrhea 05/08/11  . Adenomyosis 05/08/11  . Pelvic pain 1/2/`13    Past Surgical History  Procedure Laterality Date  . Hernia repair    . Wisdom tooth extraction    . Cesarean section       x 3    Family History  Problem Relation Age of Onset  . Cancer Mother   . Diabetes Father   . Hypertension Father   . Asthma Sister       Social History:  reports that she has never smoked. She has never used smokeless tobacco. She reports that she does not drink alcohol or use illicit drugs.  No Known Allergies  MEDICATIONS:  Scheduled: . naproxen  375 mg Oral BID WC  . pantoprazole  40 mg Oral Q0600     ROS:                                                                                                                                       History obtained from the patient  General ROS: negative for - chills, fatigue, fever, night sweats, weight gain or weight loss Psychological ROS: negative for - behavioral disorder, hallucinations, memory difficulties, mood swings or suicidal ideation Ophthalmic ROS: negative for - blurry vision, double vision, eye pain or loss of vision ENT ROS: negative for - epistaxis, nasal discharge, oral lesions, sore throat, tinnitus or vertigo Allergy and Immunology ROS: negative for - hives or itchy/watery eyes Hematological and Lymphatic ROS: negative for - bleeding problems, bruising or swollen lymph nodes Endocrine ROS: negative for - galactorrhea, hair pattern changes, polydipsia/polyuria or temperature intolerance Respiratory ROS: negative for - cough, hemoptysis, shortness of breath or wheezing Cardiovascular ROS: negative for - chest pain, dyspnea on exertion, edema or irregular heartbeat Gastrointestinal ROS: negative for - abdominal pain, diarrhea, hematemesis, nausea/vomiting or stool incontinence Genito-Urinary ROS: negative for - dysuria, hematuria, incontinence or urinary frequency/urgency Musculoskeletal ROS: negative for - joint swelling or muscular weakness Neurological ROS: as noted in HPI Dermatological ROS: negative for rash and skin lesion changes   Physical exam: pleasant but very emotional, crying. Head: normocephalic. Neck: supple, no bruits, no JVD. Cardiac: no murmurs. Lungs: clear. Abdomen: soft, no tender, no mass. Extremities: no edema.  Blood pressure 135/65, pulse 63, temperature 98.2 F (36.8 C), temperature source Oral, resp. rate 18, SpO2  100.00%.   Neurologic Examination:                                                                                                      Mental Status: Alert, oriented, thought content appropriate.  Speech fluent without evidence of aphasia.  Able to follow 3 step commands without difficulty. Cranial Nerves: II: Discs flat bilaterally; Visual fields grossly normal, pupils equal, round, reactive to light and accommodation III,IV, VI: ptosis not present, extra-ocular motions intact bilaterally V,VII: smile symmetric, facial light touch sensation normal bilaterally VIII: hearing normal bilaterally IX,X: gag reflex present XI: bilateral shoulder shrug XII: midline tongue extension without atrophy or fasciculations  Motor: Exam is not consistent with give way weakness.  When she is distracted shows 4/5 throughout.  No lateralizing weakness noted.  Tone and bulk:normal tone throughout; no  atrophy noted Sensory: Pinprick and light touch intact throughout, bilaterally Deep Tendon Reflexes:  Right: Upper Extremity   Left: Upper extremity   biceps (C-5 to C-6) 2/4   biceps (C-5 to C-6) 2/4 tricep (C7) 2/4    triceps (C7) 2/4 Brachioradialis (C6) 2/4  Brachioradialis (C6) 2/4  Lower Extremity Lower Extremity  quadriceps (L-2 to L-4) 2/4   quadriceps (L-2 to L-4) 2/4 Achilles (S1) 2/4   Achilles (S1) 2/4  Plantars: Right: downgoing   Left: downgoing Cerebellar: normal finger-to-nose,  normal heel-to-shin test Gait: patient was able to stand unassisted but when told she is doing well she quickly sat down and allowed herself to lay back in the bed.  CV: pulses palpable throughout    No results found for this basename: cbc, bmp, coags, chol, tri, ldl, hga1c    Results for orders placed during the hospital encounter of 03/22/13 (from the past 48 hour(s))  TROPONIN I     Status: None   Collection Time    03/22/13  7:13 PM      Result Value Range   Troponin I <0.30  <0.30 ng/mL   Comment:             Due to the release kinetics of cTnI,     a negative result within the first hours     of the onset of symptoms does not rule out     myocardial infarction with certainty.     If myocardial infarction is still suspected,     repeat the test at appropriate intervals.  TROPONIN I     Status: None   Collection Time    03/22/13 11:22 PM      Result Value Range   Troponin I <0.30  <0.30 ng/mL   Comment:            Due to the release kinetics of cTnI,     a negative result within the first hours     of the onset of symptoms does not rule out     myocardial infarction with certainty.     If myocardial infarction is still suspected,     repeat the test at appropriate intervals.  GLUCOSE, CAPILLARY     Status: Abnormal   Collection Time    03/23/13  1:00 PM      Result Value Range   Glucose-Capillary 116 (*) 70 - 99 mg/dL   Comment 1 Notify RN     Comment 2 Documented in Chart    BASIC METABOLIC PANEL     Status: Abnormal   Collection Time    03/23/13  1:30 PM      Result Value Range   Sodium 138  135 - 145 mEq/L   Potassium 3.6  3.5 - 5.1 mEq/L   Chloride 105  96 - 112 mEq/L   CO2 23  19 - 32 mEq/L   Glucose, Bld 139 (*) 70 - 99 mg/dL   BUN 15  6 - 23 mg/dL   Creatinine, Ser 9.60  0.50 - 1.10 mg/dL   Calcium 9.0  8.4 - 45.4 mg/dL   GFR calc non Af Amer >90  >90 mL/min   GFR calc Af Amer >90  >90 mL/min   Comment: (NOTE)     The eGFR has been calculated using the CKD EPI equation.     This calculation has not been validated in all clinical situations.     eGFR's persistently <90 mL/min signify possible Chronic Kidney  Disease.  TSH     Status: None   Collection Time    03/23/13  5:00 PM      Result Value Range   TSH 1.493  0.350 - 4.500 uIU/mL   Comment: Performed at [facility]  BASIC METABOLIC PANEL     Status: Abnormal   Collection Time    03/24/13  5:50 AM      Result Value Range   Sodium 139  135 - 145 mEq/L   Potassium 5.3 (*) 3.5 - 5.1 mEq/L    Comment: HEMOLYSIS AT THIS LEVEL MAY AFFECT RESULT     DELTA CHECK NOTED   Chloride 109  96 - 112 mEq/L   CO2 23  19 - 32 mEq/L   Glucose, Bld 85  70 - 99 mg/dL   BUN 12  6 - 23 mg/dL   Creatinine, Ser 2.95  0.50 - 1.10 mg/dL   Calcium 8.5  8.4 - 62.1 mg/dL   GFR calc non Af Amer >90  >90 mL/min   GFR calc Af Amer >90  >90 mL/min   Comment: (NOTE)     The eGFR has been calculated using the CKD EPI equation.     This calculation has not been validated in all clinical situations.     eGFR's persistently <90 mL/min signify possible Chronic Kidney     Disease.  GLUCOSE, CAPILLARY     Status: None   Collection Time    03/24/13 10:09 AM      Result Value Range   Glucose-Capillary 88  70 - 99 mg/dL    No results found.  Assessment and plan per attending neurologist  Felicie Morn PA-C [facility] [phone]  03/24/2013, 1:05 PM   Assessment/Plan: 42 YO female with sudden onset of generalized weakness.  Exam shows 4/5 strength throughout with inconsistent and effort dependent strength.  patient shows no localizing or lateralizing symptoms.  Exam and history is most consistent with nonfunctional etiology. Do not recommend further neurological workup.   Recommend: 1) PT/OT  Patient seen and examined together with physician assistant and I concur with the assessment and plan.  Wyatt Portela, MD

## 2013-03-25 ENCOUNTER — Inpatient Hospital Stay (HOSPITAL_COMMUNITY): Admission: RE | Admit: 2013-03-25 | Payer: BC Managed Care – PPO | Source: Ambulatory Visit

## 2013-03-25 DIAGNOSIS — E875 Hyperkalemia: Secondary | ICD-10-CM

## 2013-03-25 DIAGNOSIS — F339 Major depressive disorder, recurrent, unspecified: Secondary | ICD-10-CM

## 2013-03-25 LAB — BASIC METABOLIC PANEL
BUN: 11 mg/dL (ref 6–23)
Calcium: 9 mg/dL (ref 8.4–10.5)
GFR calc non Af Amer: >90 mL/min (ref >90)
Glucose, Bld: 124 mg/dL — ABNORMAL HIGH (ref 70–99)

## 2013-03-25 MED ORDER — CLONAZEPAM 0.5 MG PO TABS
0.2500 mg | ORAL_TABLET | Freq: Two times a day (BID) | ORAL | Status: DC
  Administered 2013-03-25 – 2013-03-26 (×2): 0.25 mg via ORAL
  Filled 2013-03-25 (×2): qty 1

## 2013-03-25 MED ORDER — FLUOXETINE HCL 20 MG PO CAPS
20.0000 mg | ORAL_CAPSULE | Freq: Every day | ORAL | Status: DC
  Administered 2013-03-25 – 2013-03-26 (×2): 20 mg via ORAL
  Filled 2013-03-25 (×2): qty 1

## 2013-03-25 NOTE — Progress Notes (Signed)
Clinical Social Work Department CLINICAL SOCIAL WORK PLACEMENT NOTE 03/25/2013  Patient:  Jane Munoz, Jane Munoz  Account Number:  [id number] Admit date:  03/22/2013  Clinical Social Worker:  Sharol Harness, Theresia Majors  Date/time:  03/25/2013 10:00 AM  Clinical Social Work is seeking post-discharge placement for this patient at the following level of care:   SKILLED NURSING   (*CSW will update this form in Epic as items are completed)   03/25/2013  Patient/family provided with [facility] Department of Clinical Social Work's list of facilities offering this level of care within the geographic area requested by the patient (or if unable, by the patient's family).  03/25/2013  Patient/family informed of their freedom to choose among providers that offer the needed level of care, that participate in Medicare, Medicaid or managed care program needed by the patient, have an available bed and are willing to accept the patient.  03/25/2013  Patient/family informed of [facility]' ownership interest in [facility], as well as of the fact that they are under no obligation to receive care at this facility.  PASARR submitted to EDS on 03/25/2013 PASARR number received from EDS on 03/25/2013  FL2 transmitted to all facilities in geographic area requested by pt/family on  03/25/2013 FL2 transmitted to all facilities within larger geographic area on   Patient informed that his/her managed care company has contracts with or will negotiate with  certain facilities, including the following:     Patient/family informed of bed offers received:   Patient chooses bed at  Physician recommends and patient chooses bed at    Patient to be transferred to  on   Patient to be transferred to facility by   The following physician request were entered in Epic:   Additional CommentsSharol Harness, LCSWA [phone]
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
## 2013-03-25 NOTE — Progress Notes (Signed)
[facility] PROGRESS NOTE    Jane Munoz WUJ:[id number] DOB: July 04, 1970 DOA: 03/22/2013 PCP: Pcp Not In System  [facility] will sign off on 03/25/2013. Please call for any further assistance.  HPI/Brief narrative Patient admitted on the cardiology service for chest pain and elevated d-dimer. CT angiography was negative for PE. She was supposed to be discharged 11/19 by cardiology, however upon ambulating in the hallway, patient complained of sudden onset weakness and had a presyncopal event. Orthostatic BP's were negative..There was no loss of consciousness or falls. 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 She was supposed to have that done last year however her research did not work as expected. She is separated and 2 children live with her and a son lives with the father. Neurology consulted and did not see any focal signs.  Assessment/Plan:  Generalized weakness - no obvious organic etiology seen for her presentation. - Neurology does not recommend any further neuro work up. - Exam today, patient effort seems inconsistent. - Orthostatic BP's: negative. - Patient apparently sees Psychologist, forensic as OP. - Psychiatry consultation pending - TSH normal.  History of anxiety & depression - as above  Hypokalemia/Hyperkalemia - Sample hemolyzed - Repeat BMP.   DVT prophylaxis: Lovenox Family Communication: Discussed with sister at bedside, who just arrived from [place] Disposition Plan: Per primary service   Subjective: Headache. States feels stronger today. Per nursing, was able to get up by herself from bed to bedside commode and after finishing, return to bed and flung herself back stating she was weak but was able to lift her legs up to bed by herself. When instructed by nurse, was able to sit up.  Objective: Filed Vitals:   03/25/13 0951 03/25/13 0952 03/25/13 0953 03/25/13 0958  BP:  107/66 120/77 119/53 112/59  Pulse: 83 83 66 64  Temp:      TempSrc:      Resp:      SpO2: 100% 100% 100%     Intake/Output Summary (Last 24 hours) at 03/25/13 1442 Last data filed at 03/25/13 0900  Gross per 24 hour  Intake    360 ml  Output      0 ml  Net    360 ml   There were no vitals filed for this visit.   Exam:  General exam: Young female lying comfortable in bed. Flat affect. Respiratory system: Clear. No increased work of breathing. Cardiovascular system: S1 & S2 heard, RRR. No JVD, murmurs, gallops, clicks or pedal edema. Gastrointestinal system: Abdomen is nondistended, soft and nontender. Normal bowel sounds heard. Central nervous system: Alert and oriented. No focal neurological deficits. Extremities: Symmetric at least 4+/5 power all limbs. Seems to be providing inconsistent/suboptimal effort   Data Reviewed: Basic Metabolic Panel:  Recent Labs Lab 03/22/13 1044 03/23/13 1330 03/24/13 0550  NA 141 138 139  K 3.4* 3.6 5.3*  CL 105 105 109  CO2 27 23 23   GLUCOSE 105* 139* 85  BUN 11 15 12   CREATININE 0.53 0.65 0.57  CALCIUM 9.9 9.0 8.5   Liver Function Tests: No results found for this basename: AST, ALT, ALKPHOS, BILITOT, PROT, ALBUMIN,  in the last 168 hours No results found for this basename: LIPASE, AMYLASE,  in the last 168 hours No results found for this basename: AMMONIA,  in the last 168 hours CBC:  Recent Labs Lab 03/22/13 1044  WBC 5.4  HGB  14.6  HCT 43.0  MCV 88.7  PLT 275   Cardiac Enzymes:  Recent Labs Lab 03/22/13 1913 03/22/13 2322  TROPONINI <0.30 <0.30   BNP (last 3 results) No results found for this basename: PROBNP,  in the last 8760 hours CBG:  Recent Labs Lab 03/23/13 1300 03/24/13 1009  GLUCAP 116* 88    No results found for this or any previous visit (from the past 240 hour(s)).     Studies: No results found.      Scheduled Meds: . enoxaparin (LOVENOX) injection  40 mg Subcutaneous Q24H  .  naproxen  375 mg Oral BID WC  . pantoprazole  40 mg Oral Q0600   Continuous Infusions:   Principal Problem:   Chest pain with minimal risk of acute coronary syndrome Active Problems:   Menorrhagia   Dysmenorrhea   IUD contraception-inserted 11/18/11   ASD (atrial septal defect)-? by history when she was pregnanat   Palpitations   Elevated d-dimer- 1.29 on Oct 27th 2014- CTA neg for PE, LE venous dopplers neg for DVT 03/10/13   Dyspnea   Anxiety and depression    Time spent: 30 minutes.    Marcellus Scott, MD, FACP, FHM. [facility] Pager [phone]  If 7PM-7AM, please contact night-coverage [email or web address] Password [email or web address] 03/25/2013, 2:42 PM    LOS: 3 days

## 2013-03-25 NOTE — Progress Notes (Signed)
Subjective: Patient states she feels better but is dizzy and apparently became dizzy when standing during orthostatic vitals.   Objective: Vital signs in last 24 hours: Temp:  [98.1 F (36.7 C)-98.4 F (36.9 C)] 98.4 F (36.9 C) (11/20 0518) Pulse Rate:  [60-63] 60 (11/20 0518) Resp:  [16] 16 (11/20 0518) BP: (90-123)/(41-83) 90/41 mmHg (11/20 0518) SpO2:  [99 %-100 %] 100 % (11/20 0518) Last BM Date: 03/23/13  Intake/Output from previous day: 11/19 0701 - 11/20 0700 In: -  Out: 300 [Urine:300] Intake/Output this shift: Total I/O In: 360 [P.O.:360] Out: -   Medications Current Facility-Administered Medications  Medication Dose Route Frequency Provider Last Rate Last Dose  . acetaminophen (TYLENOL) tablet 650 mg  650 mg Oral Q4H PRN Abelino Derrick, PA-C      . ALPRAZolam Prudy Feeler) tablet 0.25 mg  0.25 mg Oral TID PRN Abelino Derrick, PA-C   0.25 mg at 03/22/13 2147  . enoxaparin (LOVENOX) injection 40 mg  40 mg Subcutaneous Q24H Eldridge Scot, MD      . naproxen (NAPROSYN) tablet 375 mg  375 mg Oral BID WC Eda Paschal Kilroy, PA-C   375 mg at 03/25/13 0756  . pantoprazole (PROTONIX) EC tablet 40 mg  40 mg Oral Q0600 Abelino Derrick, PA-C   40 mg at 03/25/13 4782  . traMADol (ULTRAM) tablet 50 mg  50 mg Oral Q6H PRN Abelino Derrick, PA-C      . zolpidem (AMBIEN) tablet 5 mg  5 mg Oral QHS PRN Runell Gess, MD        PE: General appearance: alert, cooperative and no distress Lungs: clear to auscultation bilaterally Heart: regular rate and rhythm, S1, S2 normal, no murmur, click, rub or gallop Extremities: No LEE Pulses: 2+ and symmetric Skin: Warm and dry Neurologic: Grossly normal  Lab Results:   Recent Labs  03/22/13 1044  WBC 5.4  HGB 14.6  HCT 43.0  PLT 275   BMET  Recent Labs  03/22/13 1044 03/23/13 1330 03/24/13 0550  NA 141 138 139  K 3.4* 3.6 5.3*  CL 105 105 109  CO2 27 23 23   GLUCOSE 105* 139* 85  BUN 11 15 12   CREATININE 0.53 0.65 0.57    CALCIUM 9.9 9.0 8.5    Assessment/Plan  Principal Problem:   Chest pain with minimal risk of acute coronary syndrome Active Problems:   Menorrhagia   Dysmenorrhea   IUD contraception-inserted 11/18/11   ASD (atrial septal defect)-? by history when she was pregnanat   Palpitations   Elevated d-dimer- 1.29 on Oct 27th 2014- CTA neg for PE, LE venous dopplers neg for DVT 03/10/13   Dyspnea   Anxiety and depression  Plan:  No further CP.  Telemetry shows NSR consistently.  Orthostatic vital signs are normal(L-107/66, Sit-120/77, Stand-119/53).    No identified medical etiology to explain weakness.  Psychiatry consult pending and willlikely be this  Evening..     LOS: 3 days    HAGER, BRYAN 03/25/2013 9:55 AM  Echo Study Conclusions  - Left ventricle: The cavity size was normal. Wall thickness was normal. Systolic function was normal. The estimated ejection fraction was in the range of 55% to 65%. Wall motion was normal; there were no regional wall motion abnormalities. Doppler parameters are consistent with abnormal left ventricular relaxation (grade 1 diastolic dysfunction). - Mitral valve: Elongation of the anterior leaflet. - Atrial septum: No defect or patent foramen ovale was identified. There was no  atrial level shunt. - Pericardium, extracardiac: A trivial pericardial effusion was identified posterior to the heart.        Patient seen and examined. Agree with assessment and plan. Pt apparently had another episode of significant dizziness/weakeness while taking orthostatics; no BP drop. Maintaining sinus rhythm.  Pt and family member were questioning about shunt, clot, mitochondrial issues.  If recurent presyncope ? Tilt study;  ??? mitichondrial myopathy, but doubt.   Lennette Bihari, MD, [employer] 03/25/2013 11:07 AM

## 2013-03-25 NOTE — Progress Notes (Signed)
CSW Proofreader) spoke with pt and pt sister about SNF. CSW explained purpose of recommendation from PT. Pt sister very concerned that pt "will be sent to SNF as substitution to receiving care at the hospital". CSW explained that pt will have to be medically stable and all care will have to be appropriate for SNF level of care before pt is discharged. Pt and pt sister agreeable to being faxed out to [facility].  Aldene Hendon, LCSWA [phone]

## 2013-03-26 DIAGNOSIS — R079 Chest pain, unspecified: Secondary | ICD-10-CM

## 2013-03-26 MED ORDER — MECLIZINE HCL 12.5 MG PO TABS: 12.5000 mg | ORAL_TABLET | Freq: Three times a day (TID) | ORAL | Status: DC | PRN

## 2013-03-26 NOTE — Care Management Note (Signed)
    Page 1 of 2   03/26/2013     11:14:48 AM   CARE MANAGEMENT NOTE 03/26/2013  Patient:  Jane Munoz, Jane Munoz   Account Number:  [id number]  Date Initiated:  03/26/2013  Documentation initiated by:  Donn Pierini  Subjective/Objective Assessment:   Pt admitted with chest pain, weakness     Action/Plan:   PTA pt lived at home with family and children   Anticipated DC Date:  03/26/2013   Anticipated DC Plan:  HOME W HOME HEALTH SERVICES      DC Planning Services  CM consult      [facility] Choice  HOME HEALTH   Choice offered to / List presented to:  C-1 Patient   DME arranged  Levan Hurst      DME agency  [facility]     HH arranged  HH-2 PT  HH-3 OT      [facility] agency  [facility]   Status of service:  Completed, signed off Medicare Important Message given?   (If response is "NO", the following Medicare IM given date fields will be blank) Date Medicare IM given:   Date Additional Medicare IM given:    Discharge Disposition:  HOME W HOME HEALTH SERVICES  Per UR Regulation:  Reviewed for med. necessity/level of care/duration of stay  If discussed at Long Length of Stay Meetings, dates discussed:    Comments:  03/26/13- 1100- Donn Pierini RN, BSN [phone] Pt for d/c today has chosen to return home with sister to support her, in to speak with pt and sister- per conversation pt is agreeable to [facility] services- list of agencies for [facility] given to pt for review- per pt choice will use [facility] for [facility] -PT/OT services. Sister here from [place] and will stay with pt- Referral called to Lupita Leash with [facility]- services to begin within 24-48 hr post discharge. Derrian with [facility] called regarding DME need for RW- which will be delivered to room prior to discharge.

## 2013-03-26 NOTE — Progress Notes (Signed)
HOME HEALTH AGENCIES SERVING [place]   Agencies that are Medicare-Certified and are affiliated with [facility]  Telephone Number Address  [facility]   [facility] has ownership interest in this company; however, you are under no obligation to use this agency. [phone] or  [phone] [address] [place], [place] [address] [email or web address]   Agencies that are Medicare-Certified and are not affiliated with [facility]                                                                                 Home Health Agency Telephone Number Address  [facility] [phone] Fax [phone] [address] [place], [place]  [address] [email or web address]  [facility] [phone] or [phone] Fax [phone] [address] [place], [place] [address] [email or web address]  [facility] [phone] Fax [phone] [address] [place], [place] [address] [email or web address]  [facility] [phone] Fax [phone] [address] [place], [place]  [address] [email or web address]  [facility] [phone] Fax [phone] [address], [address] [place], [place] [address] [email or web address]  [facility] [phone] [address] [place], [place] [address] [email or web address]  [facility] [phone]  [address] [place], [place] [address] [email or web address]  [facility] [phone] or [phone] Fax number [phone] [address] [place], [place]  [address] [email or web address]  [facility] [phone] Fax [phone] [address] [place], [place]  [address]  [facility]  [phone] Fax [phone] [address] [place], [place] [address] http://[email or web address]       Agencies that are not Medicare-Certified and are not affiliated with [facility]   Home Health Agency Telephone Number Address  [facility] [phone] or [phone] Fax [phone] [address] [place], [place]  [address] [email or web address]  [facility] [phone] Fax [phone] [address] [place], [place]  [address] [email or web address]  [facility] [phone] Fax [phone] [address] [place], [place]  [address] http://[email or web address]  [facility]  [phone] Fax [phone] [address] [place], [place] [address] http://[email or web address]  [facility] [phone] Fax [phone] [address] [place], [place] [address]  [facility] [phone] or [phone] Fax [phone] [address] [place], [place]  [address] http://[email or web address]  [facility] [phone] or [phone] Fax [phone] [address] [place], [place]  [address] [email or web address]  [facility] [phone] Fax [phone] [address] [place], [place]  [address] [email or web address]  [facility] [phone] [address] [place], [place]  [address]  [facility] [phone] Fax [phone] [address] [place], [place]  [address]  [facility] [phone] Fax [phone] [address] [place], [place]  [address] [email or web address]  [facility] [phone] Fax [phone] [address] [place], [place] [address] [email or web address]  [facility] [phone] Fax [phone] [address] [place], [place]    [address] [email or web address]   

## 2013-03-26 NOTE — Progress Notes (Signed)
Pt wheeled to private vehicle in wheelchair with walker. I took her to her car myself and she got in her car safely.

## 2013-03-26 NOTE — Progress Notes (Signed)
I was called by the RN.  The patient started to complain of dizziness as she was getting ready for DC.   We checked her orthostatic vitals which were normal.  After discussion with the patient's sister and the patient, they have declined SNF placement and the sister will take care of her at home.  Home health PT will be ordered.    Exam: Cardio: RRR no MRG Lungs: CTA no wheeze  Jane Munoz 10:59 AM

## 2013-03-26 NOTE — Progress Notes (Signed)
I was reviewing patients discharge instructions with patient and her sister.  The patient and attempted to get out of bed and after 5 steps, she said she felt dizzy. I immediately sat her down in a chair. I checked her vitals signs when she sat down was 122/77. Heart rate 86, stas 100% on room air. When we restood her, her bp was 117/76, heart rate 91, sats 100 on ra. Jones Skene at bedside and sister doesn't want SNF but wants PT at home. Plan to d/c with home health pt

## 2013-03-26 NOTE — Progress Notes (Signed)
CSW (Clinical Child psychotherapist) present in room with pt nurse and PA as they discussed pt medical status and discharge with pt sister. CSW confirmed plan will be to return home. Pt sister not wanting pt to be taken to a skilled nursing facility. CSW has notified CM of home health request.  Sharol Harness, LCSWA [phone]

## 2013-03-26 NOTE — Progress Notes (Signed)
    Subjective: Feeling better  Objective: Vital signs in last 24 hours: Temp:  [98.1 F (36.7 C)-98.8 F (37.1 C)] 98.3 F (36.8 C) (11/21 0432) Pulse Rate:  [60-83] 68 (11/21 0432) Resp:  [18] 18 (11/21 0432) BP: (100-126)/(53-81) 100/57 mmHg (11/21 0432) SpO2:  [96 %-100 %] 99 % (11/21 0432) Weight:  [172 lb (78.019 kg)] 172 lb (78.019 kg) (11/21 0726) Last BM Date: 03/23/13  Intake/Output from previous day: 11/20 0701 - 11/21 0700 In: 680 [P.O.:680] Out: -  Intake/Output this shift:    Medications Current Facility-Administered Medications  Medication Dose Route Frequency Provider Last Rate Last Dose  . acetaminophen (TYLENOL) tablet 650 mg  650 mg Oral Q4H PRN Eda Paschal Kilroy, PA-C      . clonazePAM Scarlette Calico) tablet 0.25 mg  0.25 mg Oral BID Nehemiah Settle, MD   0.25 mg at 03/25/13 2109  . enoxaparin (LOVENOX) injection 40 mg  40 mg Subcutaneous Q24H Eldridge Scot, MD   40 mg at 03/25/13 1130  . FLUoxetine (PROZAC) capsule 20 mg  20 mg Oral Daily Nehemiah Settle, MD   20 mg at 03/25/13 1833  . naproxen (NAPROSYN) tablet 375 mg  375 mg Oral BID WC Eda Paschal Kilroy, PA-C   375 mg at 03/25/13 1647  . pantoprazole (PROTONIX) EC tablet 40 mg  40 mg Oral Q0600 Abelino Derrick, PA-C   40 mg at 03/25/13 1478  . traMADol (ULTRAM) tablet 50 mg  50 mg Oral Q6H PRN Abelino Derrick, PA-C      . zolpidem (AMBIEN) tablet 5 mg  5 mg Oral QHS PRN Runell Gess, MD        PE: General appearance: alert, cooperative and no distress  Lungs: clear to auscultation bilaterally  Heart: regular rate and rhythm, S1, S2 normal, no murmur, click, rub or gallop  Extremities: No LEE  Pulses: 2+ and symmetric  Skin: Warm and dry  Neurologic: Grossly normal  Lab Results:  No results found for this basename: WBC, HGB, HCT, PLT,  in the last 72 hours BMET  Recent Labs  03/23/13 1330 03/24/13 0550 03/25/13 1908  NA 138 139 137  K 3.6 5.3* 3.4*  CL 105 109 102  CO2 23 23 24    GLUCOSE 139* 85 124*  BUN 15 12 11   CREATININE 0.65 0.57 0.62  CALCIUM 9.0 8.5 9.0    Assessment/Plan  Principal Problem:   Chest pain with minimal risk of acute coronary syndrome Active Problems:   Menorrhagia   Dysmenorrhea   IUD contraception-inserted 11/18/11   ASD (atrial septal defect)-? by history when she was pregnanat   Palpitations   Elevated d-dimer- 1.29 on Oct 27th 2014- CTA neg for PE, LE venous dopplers neg for DVT 03/10/13   Dyspnea   Anxiety and depression  Plan:  Amazing turn around today with return of strength.  Able to sit up in bed without difficulty.  Seen by Psychiatry: major depressive DO, anxiety.   They restarted Prozac and klonopin.  BP and HR stable.  She says she wants to go home.  DC today.  Needs psychiatry FU.     LOS: 4 days    Jane Munoz 03/26/2013 8:50 AM

## 2013-03-26 NOTE — Discharge Summary (Signed)
Physician Discharge Summary  Patient ID: Jane Munoz MRN: [id number] DOB/AGE: 1971-02-28 42 y.o.  Admit date: 03/22/2013 Discharge date: 03/26/2013  Admission Diagnoses:  Chest pain with minimal risk for ACS  Discharge Diagnoses:  Principal Problem:   Anxiety and depression Active Problems:   Chest pain with minimal risk of acute coronary syndrome   Menorrhagia   Dysmenorrhea   IUD contraception-inserted 11/18/11   ASD (atrial septal defect)-? by history when she was pregnanat   Palpitations   Elevated d-dimer- 1.29 on Oct 27th 2014- CTA neg for PE, LE venous dopplers neg for DVT 03/10/13   Dyspnea   Discharged Condition: stable  Hospital Course:  42 y/o female seen by Dr Royann Shivers recently after she had an episode of chest pain with an elevated D-dimer. This was 03/01/13. CTA was negative for PE. She complained of palpitations and a monitor is in place. It has shown sinus rhythm, sinus tach despite multiple activations by the patient. She presented around 9:30 am.  She had tachycardia followed by localized Lt chest pain.  Her EKG, CXR, and Troponin were normal. She continued to have some Lt chest pain. There is a pleuritic component. She denied fever, chills or hemoptysis.  The patient was admitted for observation on Monday, 11/17.  She ruled out for MI and 2D echo showed normal EF and grade one diastolic dysfunction.  She was in the process of being discharged and was walking in the hall when she apparently collapsed into the arms of the RN.  She was noted to have some tachycardia but otherwise her telemetry has been NSR the entire time.  EP was consulted.  Orthostatic vitals were negative.  The following day she allegedly had profound generalized weakness and the pt could not even hold up her head.  CBG was normal.  PT, Neurology, IM and then psychiatry were consulted.  Psychiatry started klonopin and prozac.  Pt recommended SNF placement and CSW spent time arranging.  On Friday the  patient amazingly felt much better with return of strength.  The patient was seen and felt stable for discharge.   Follow up with psychiatrist is recommended.    Consults: neurology, psychiatry and IM, EP, PT  Significant Diagnostic Studies:  2D echo Study Conclusions  - Left ventricle: The cavity size was normal. Wall thickness was normal. Systolic function was normal. The estimated ejection fraction was in the range of 55% to 65%. Wall motion was normal; there were no regional wall motion abnormalities. Doppler parameters are consistent with abnormal left ventricular relaxation (grade 1 diastolic dysfunction). - Mitral valve: Elongation of the anterior leaflet. - Atrial septum: No defect or patent foramen ovale was identified. There was no atrial level shunt. - Pericardium, extracardiac: A trivial pericardial effusion was identified posterior to the heart.  Treatments: See above  Discharge Exam: Blood pressure 100/57, pulse 68, temperature 98.3 F (36.8 C), temperature source Oral, resp. rate 18, height 5' (1.524 m), weight 172 lb (78.019 kg), SpO2 99.00%.   Disposition: 01-Home or Self Care      Discharge Orders   Future Appointments Provider Department Dept Phone   04/08/2013 11:15 AM Thurmon Fair, MD [facility] [phone]   Future Orders Complete By Expires   Discharge instructions  As directed    Comments:     Call for follow up with your psychiatrist.   Increase activity slowly  As directed        Medication List         FLUoxetine  20 MG capsule  Commonly known as:  PROZAC  Take 20 mg by mouth daily.     LORazepam 0.5 MG tablet  Commonly known as:  ATIVAN  Take 0.5 mg by mouth 2 (two) times daily as needed. For anxiety     meclizine 12.5 MG tablet  Commonly known as:  ANTIVERT  Take 1 tablet (12.5 mg total) by mouth 3 (three) times daily as needed for dizziness.     multivitamin with minerals Tabs tablet  Take 1 tablet by mouth  daily.     pantoprazole 40 MG tablet  Commonly known as:  PROTONIX  Take 1 tablet (40 mg total) by mouth daily at 6 (six) AM.     traMADol 50 MG tablet  Commonly known as:  ULTRAM  Take 1 tablet (50 mg total) by mouth every 6 (six) hours as needed for moderate pain.       Follow-up Information   Follow up with Thurmon Fair, MD On 04/08/2013. (at 11:15 AM as previously arranged.)    Specialty:  Cardiology   Contact information:   [address] [place] [address] [phone]       Follow up with Psychiatrist. (Call for appointment)       Signed: Wilburt Finlay 03/26/2013, 12:02 PM

## 2013-03-29 ENCOUNTER — Telehealth: Payer: Self-pay | Admitting: Cardiovascular Disease

## 2013-03-29 NOTE — Telephone Encounter (Signed)
Wants to know if she can get an order for nursing please.

## 2013-03-29 NOTE — Telephone Encounter (Signed)
For which indication?

## 2013-03-29 NOTE — Telephone Encounter (Signed)
Message forwarded to Dr. Croitoru.  

## 2013-03-30 NOTE — Telephone Encounter (Signed)
Jane Munoz w/ [facility] called back.  Stated they received orders for PT and wanted to know if they could have orders for nursing.  RN asked for reason.  Jane Munoz stated they wanted to do a general assessment and med mgmt.  Informed MD will be notified.  Verbalized understanding and agreed w/ plan.  Message forwarded to Dr. Royann Munoz.

## 2013-03-30 NOTE — Telephone Encounter (Signed)
Returned call.  Left generic message that nursing order is okay to call back before 4pm if questions.

## 2013-03-30 NOTE — Telephone Encounter (Signed)
Returned call.  Left message to call back before 4pm.  

## 2013-03-30 NOTE — Telephone Encounter (Signed)
OK for nursing order?

## 2013-03-31 ENCOUNTER — Ambulatory Visit: Payer: BC Managed Care – PPO | Admitting: Cardiovascular Disease

## 2013-04-05 ENCOUNTER — Telehealth: Payer: Self-pay | Admitting: Cardiovascular Disease

## 2013-04-05 NOTE — Telephone Encounter (Signed)
Please call-needs an order for a Child psychotherapist.Pt Has a retarded child that she has a terrible time with.She definitely needs help.

## 2013-04-05 NOTE — Telephone Encounter (Signed)
Spoke with Purnell Shoemaker, home health RN. Purnell Shoemaker reports that was visiting patient and patient appears to be under stress with special needs child. Reports child has medicaid, so should qualify for some type of social work assistance. Purnell Shoemaker stated she just needed a verbal order to get social work involved to help patient. Please advise.

## 2013-04-05 NOTE — Telephone Encounter (Signed)
Social work sounds like a good idea. Please order

## 2013-04-07 NOTE — Telephone Encounter (Signed)
Order given for Jane Munoz to get a Child psychotherapist in the house to see if they can help her.

## 2013-04-08 ENCOUNTER — Encounter: Payer: Self-pay | Admitting: Cardiovascular Disease

## 2013-04-08 ENCOUNTER — Encounter: Payer: Self-pay | Admitting: Neurology

## 2013-04-08 ENCOUNTER — Ambulatory Visit (INDEPENDENT_AMBULATORY_CARE_PROVIDER_SITE_OTHER): Payer: BC Managed Care – PPO | Admitting: Neurology

## 2013-04-08 ENCOUNTER — Ambulatory Visit (INDEPENDENT_AMBULATORY_CARE_PROVIDER_SITE_OTHER): Payer: BC Managed Care – PPO | Admitting: Cardiovascular Disease

## 2013-04-08 VITALS — BP 110/80 | HR 82 | Ht 60.0 in | Wt 175.6 lb

## 2013-04-08 VITALS — BP 126/92 | HR 96 | Temp 98.5°F | Wt 175.0 lb

## 2013-04-08 DIAGNOSIS — R0683 Snoring: Secondary | ICD-10-CM

## 2013-04-08 DIAGNOSIS — R5381 Other malaise: Secondary | ICD-10-CM

## 2013-04-08 DIAGNOSIS — R400 Somnolence: Secondary | ICD-10-CM

## 2013-04-08 DIAGNOSIS — R002 Palpitations: Secondary | ICD-10-CM

## 2013-04-08 DIAGNOSIS — R531 Weakness: Secondary | ICD-10-CM

## 2013-04-08 DIAGNOSIS — M542 Cervicalgia: Secondary | ICD-10-CM

## 2013-04-08 DIAGNOSIS — Q211 Atrial septal defect: Secondary | ICD-10-CM

## 2013-04-08 DIAGNOSIS — R0609 Other forms of dyspnea: Secondary | ICD-10-CM

## 2013-04-08 DIAGNOSIS — R51 Headache: Secondary | ICD-10-CM

## 2013-04-08 DIAGNOSIS — R42 Dizziness and giddiness: Secondary | ICD-10-CM

## 2013-04-08 DIAGNOSIS — G4710 Hypersomnia, unspecified: Secondary | ICD-10-CM

## 2013-04-08 NOTE — Progress Notes (Signed)
Subjective:    Patient ID: Jane Munoz is a 42 y.o. female.  HPI   Huston Foley, MD, PhD [facility] [address] [address] [place], [place] [address]  Dear Dr. Hulan Saas,   I saw your patient, Jane Munoz, upon your kind request in my neurologic clinic today for initial consultation of her dizziness. The patient is unaccompanied today. As you know, Jane Munoz is a 42 year old right-handed woman with an underlying medical history of menorrhagia, anxiety, depression, and palpitations who has been experiencing recurrent episodes of dizziness for the past month. She presented to the emergency room on 03/22/2013 due to chest pain and palpitations and was admitted to cardiology for further workup. During the four-day hospital stay she was seen by psychiatry in consultation for recurrence of her anxiety and depression and was advised to restart her Prozac. Neurology was also consulted for an episode of generalized weakness and near syncope. She had a global strength of 4/5 per consultation report but variable effort. She was given IV fluids. She was discharged with [facility] OT and PT. She feels unchanged in her Sx since the D/C from the hospital. She has a colleague, who had similar Sx and recommended she came to see me. She states, she works with [employer]. She c/o of neck pain and has been using a 2 wheeled walker since the hospital D/C. She uses meclizine prn. She describes a sense of the room spinning and feels, that her weakness is better.  I reviewed hospital records.  Chest x-ray was negative. Echocardiogram showed no abnormalities and an EF of 55-60%. No PFO was seen. She states in 2011, when she was visiting [place], she had "really bad convulsions" and was treated by her uncle, a neurologist, who prescribed some medication, which she took for 3 months. She was conscious and aware of her environment during these episodes.  She endorses a lot of stressors,  being a single mom of 2 daughters (48 yo with autism, 73 yo, 50 yo son lives with his father in [place]), working and going to graduate school.  Of note, she snores and has woken up with a sense of gasping. She wakes up with headaches. These are described as generalized and non-throbbing, more constant and achy. She endorses feeling sleepy during the day. Her EDSS is 3/24 today.  Her Past Medical History Is Significant For: Past Medical History  Diagnosis Date  . Depression   . Anxiety   . H/O varicella   . History of measles, mumps, or rubella   . Blood transfusion   . H/O blood clots   . H/O bladder infections   . Leaking of urine   . Preterm labor   . H/O dysmenorrhea 05/08/11  . Adenomyosis 05/08/11  . Pelvic pain 1/2/`13    Her Past Surgical History Is Significant For: Past Surgical History  Procedure Laterality Date  . Hernia repair    . Wisdom tooth extraction    . Cesarean section       x 3    Her Family History Is Significant For: Family History  Problem Relation Age of Onset  . Cancer Mother   . Heart Problems Mother   . Diabetes Father   . Hypertension Father   . Asthma Sister     Her Social History Is Significant For: History   Social History  . Marital Status: Divorced    Spouse Name: N/A    Number of Children: 3  . Years of Education:  Grad   Occupational History  . TEACHER    Social History Main Topics  . Smoking status: Never Smoker   . Smokeless tobacco: Never Used  . Alcohol Use: No  . Drug Use: No  . Sexual Activity: Not Currently   Other Topics Concern  . None   Social History Narrative   Patient lives at home with family.   Caffeine Use: 1 cup daily    Her Allergies Are:  No Known Allergies:   Her Current Medications Are:  Outpatient Encounter Prescriptions as of 04/08/2013  Medication Sig  . FLUoxetine (PROZAC) 20 MG capsule Take 20 mg by mouth daily.  Marland Kitchen LORazepam (ATIVAN) 0.5 MG tablet Take 0.5 mg by mouth at bedtime. For anxiety   . meclizine (ANTIVERT) 12.5 MG tablet Take 1 tablet (12.5 mg total) by mouth 3 (three) times daily as needed for dizziness.  . Multiple Vitamin (MULTIVITAMIN WITH MINERALS) TABS Take 1 tablet by mouth daily.  . naproxen (NAPROSYN) 375 MG tablet Take 375 mg by mouth 2 (two) times daily with a meal.  . pantoprazole (PROTONIX) 40 MG tablet Take 1 tablet (40 mg total) by mouth daily at 6 (six) AM.  . [DISCONTINUED] traMADol (ULTRAM) 50 MG tablet Take 1 tablet (50 mg total) by mouth every 6 (six) hours as needed for moderate pain.   Review of Systems:  Out of a complete 14 point review of systems, all are reviewed and negative with the exception of these symptoms as listed below:   Review of Systems  Constitutional: Positive for chills and fatigue.  HENT:       Ringing in ears Spinning sensation  Eyes:       Blurred vision Double vision  Respiratory: Positive for shortness of breath.   Cardiovascular: Positive for chest pain and palpitations.  Endocrine: Positive for polydipsia.  Musculoskeletal: Positive for myalgias.  Neurological: Positive for weakness and numbness.       Dizziness Passing out  Psychiatric/Behavioral:       Depression Anxiety Decreased energy    Objective:  Neurologic Exam  Physical Exam Physical Examination:   Filed Vitals:   04/08/13 1304  BP: 126/92  Pulse: 96  Temp:    General Examination: The patient is a very pleasant 42 y.o. female in no acute distress. She appears well-developed and well-nourished and well groomed. She is overweight. She appears anxious.   HEENT: Normocephalic, atraumatic, pupils are equal, round and reactive to light and accommodation. Funduscopic exam is normal with sharp disc margins noted. Extraocular tracking is good without limitation to gaze excursion or nystagmus noted. Normal smooth pursuit is noted. Hearing is grossly intact. Tympanic membranes are clear bilaterally. Face is symmetric with normal facial animation and  normal facial sensation. Speech is clear with no dysarthria noted. There is no hypophonia. There is no lip, neck/head, jaw or voice tremor. Neck is supple with full range of passive and active motion. There are no carotid bruits on auscultation. Oropharynx exam reveals: mild mouth dryness, adequate dental hygiene and moderate airway crowding, due to narrow airway, larger tonsils and redundant soft palate. Mallampati is class II. Tongue protrudes centrally and palate elevates symmetrically. Tonsils are 2+ in size. Neck size is 14.25 inches. She has no nuchal lymphadenopathy. She has no nystagmus or vertigo symptoms upon changes in her head position. She has no orthostatic blood pressure findings.  Chest: Clear to auscultation without wheezing, rhonchi or crackles noted.  Heart: S1+S2+0, regular and normal without murmurs, rubs or gallops  noted.   Abdomen: Soft, non-tender and non-distended with normal bowel sounds appreciated on auscultation.  Extremities: There is no pitting edema in the distal lower extremities bilaterally. Pedal pulses are intact.  Skin: Warm and dry without trophic changes noted. There are no varicose veins.  Musculoskeletal: exam reveals no obvious joint deformities, tenderness or joint swelling or erythema.   Neurologically:  Mental status: The patient is awake, alert and oriented in all 4 spheres. Her memory, attention, language and knowledge are appropriate. There is no aphasia, agnosia, apraxia or anomia. Speech is clear with normal prosody and enunciation. Thought process is linear. Mood is depressed and affect is blunted.  Cranial nerves are as described above under HEENT exam. In addition, shoulder shrug is normal with equal shoulder height noted. Motor exam: Normal bulk, and tone is noted. There is globally a strength of 5-/5, with some questionable effort noted. There is no drift, tremor or rebound. Romberg shows swaying and she corrects with steps forward, leaning  forward first, then suddenly correcting with a large step forward. Reflexes are 2+ throughout. Toes are downgoing bilaterally. Fine motor skills are intact with normal finger taps, normal hand movements, normal rapid alternating patting, normal foot taps and normal foot agility.  Cerebellar testing shows no dysmetria or intention tremor on finger to nose testing but she has some very deliberate and slow movements at times. Heel to shin is unremarkable bilaterally. There is no truncal or gait ataxia.  Sensory exam is intact to light touch, pinprick, vibration, temperature sense and proprioception in the upper and lower extremities.  Gait, station and balance: She stands up slowly and stands wide-based, but can stand narrow-based at my request. She preferred to use her walker and walked with a walker without any particular tendency to veer to one side, but she walks cautiously and slowly. She turns in 3 steps. She maneuvers the walker well.  Assessment and Plan:   In summary, Jane Munoz is a very pleasant 42 y.o.-year old female with a history of menorrhagia, anxiety, depression, and palpitations who has been experiencing recurrent episodes of dizziness for the past month. In addition, she has a variety of other complaints. She feels globally weak, she has recurrent neck pain and headaches. She has symptoms of vertigo off and on. She is wondering if she has had some exposure at work. I reassured her that her exam is actually nonfocal. She does have a minimal degree of weakness and on my exam she also has some deliberate movements when doing fine motor tasks. Explained to her that I am not sure how to tie in all of her problems together. Nevertheless I do not detect anything focal in her exam and reassured her in that regard. Her gait is cautious and she does have quite a bit of swaying but corrects her stop herself and does not have a tendency to fall on my exam. I discussed further testing with her and  suggested that we proceed with a brain MRI and neck MRI. Furthermore, I do note that she has a tighter looking airway and she reports snoring and waking up with choking sensations. There may be a component of obstructive sleep apnea which can produce vague daytime symptoms including morning headaches, feeling tired during the day and sometimes dizziness I explained to her. She would be willing to get these tests done and I suggested that we follow up after that. I explained to her that there is no specific medication that would help her at  this time. I agree with OT and PT and she can work on vestibular rehabilitation, gait and balance with PT and on fine motor skills with OT. She is getting home health therapies at this time. I will see her back after her tests are completed and we should also be able to call her with the test results of she can be updated. She was in agreement.   I answered all her questions today and the patient asked if she should drive. I explained to her that she does not have any focal neurologic finding or severe weakness and I explained to her that if she feels awake enough to drive she can drive. I do not detect any particular concerns that would prevent her from driving. I explained to her that she should use her good judgment. She is advised not to drive if she feels sleepy.  Thank you very much for allowing me to participate in the care of this nice patient. If I can be of any further assistance to you please do not hesitate to call me at [phone].  Sincerely,   Huston Foley, MD, PhD

## 2013-04-08 NOTE — Assessment & Plan Note (Signed)
Not confirmed by echo a saline contrast study.

## 2013-04-08 NOTE — Assessment & Plan Note (Signed)
These appear to be universally associated with sinus tachycardia, probably a physiological response to orthostatic hypotension or anxiety. Medical therapy is not necessary. I don't think she needs cardiology followup. She is encouraged to stay well hydrated. I think she needs family or friend support to help with her difficult social situation. She would like her older retired sister to visit her. Her sister is in [place] and has not been able to secure a [place] visa.

## 2013-04-08 NOTE — Patient Instructions (Addendum)
Your physician recommends that you schedule a follow-up appointment in: As Needed.  Dr. Royann Shivers will dictate a letter for you to help try to get your sister here for a visit.

## 2013-04-08 NOTE — Patient Instructions (Addendum)
Please remember, that vertigo can recur without warning. It can last hours or days. Please change positions slowly and always stay well-hydrated. Physical therapy with particular attention to vestibular rehabilitation can be very helpful. While there is no specific medication that helps with vertigo, some people get relief with as needed use of meclizine. Certain medications can exacerbate vertigo.   Please work with OT on your fine motor skills and with PT on your gait, balance and vestibular rehab.

## 2013-04-08 NOTE — Progress Notes (Signed)
Patient ID: Jane Munoz, female   DOB: Apr 10, 1971, 41 y.o.   MRN: [id number]      Reason for office visit Followup after hospitalization  After her appointment she was hospitalized for presyncope and palpitations. Her workup was unremarkable. She had an episode of presyncope while in the hospital and the monitor revealed sinus rhythm/mild sinus tachycardia. She has been wearing a 30 day event monitor that has shown similar findings. All the recordings show sinus rhythm and/or sinus tachycardia although many of the tracings have a lot of artifact. She did not have any evidence of structural abnormalities by CT angiography of the chest, echocardiography and lower shunting the venous duplex ultrasound. She even had an electrophysiology consultation while in the hospital.  She is now back at home. The home health nurse went to visit her and it is apparent that she has a lot of social strain. [place] very meager support system here. She is divorced. She is raising 2 of her children by herself. Her 81 year old lower has mental retardation and requires around-the-clock care. Since moving to [place] she no longer has Medicaid benefits and this has become a big burden.  At one point she was told that she had a "small hole in the heart". Echocardiography and saline contrast bubble study did not show any evidence of ASD or PFO. As mentioned her echo was actually completely normal.  No Known Allergies  Current Outpatient Prescriptions  Medication Sig Dispense Refill  . FLUoxetine (PROZAC) 20 MG capsule Take 20 mg by mouth daily.      Marland Kitchen LORazepam (ATIVAN) 0.5 MG tablet Take 0.5 mg by mouth at bedtime. For anxiety      . meclizine (ANTIVERT) 12.5 MG tablet Take 1 tablet (12.5 mg total) by mouth 3 (three) times daily as needed for dizziness.  30 tablet  2  . Multiple Vitamin (MULTIVITAMIN WITH MINERALS) TABS Take 1 tablet by mouth daily.      . pantoprazole (PROTONIX)  40 MG tablet Take 1 tablet (40 mg total) by mouth daily at 6 (six) AM.  30 tablet  1  . naproxen (NAPROSYN) 375 MG tablet Take 375 mg by mouth 2 (two) times daily with a meal.       No current facility-administered medications for this visit.    Past Medical History  Diagnosis Date  . Depression   . Anxiety   . H/O varicella   . History of measles, mumps, or rubella   . Blood transfusion   . H/O blood clots   . H/O bladder infections   . Leaking of urine   . Preterm labor   . H/O dysmenorrhea 05/08/11  . Adenomyosis 05/08/11  . Pelvic pain 1/2/`13    Past Surgical History  Procedure Laterality Date  . Hernia repair    . Wisdom tooth extraction    . Cesarean section       x 3    Family History  Problem Relation Age of Onset  . Cancer Mother   . Heart Problems Mother   . Diabetes Father   . Hypertension Father   . Asthma Sister     History   Social History  . Marital Status: Divorced    Spouse Name: N/A    Number of Children: 3  . Years of Education: Grad   Occupational History  . TEACHER    Social History Main Topics  . Smoking status: Never Smoker   . Smokeless  tobacco: Never Used  . Alcohol Use: No  . Drug Use: No  . Sexual Activity: Not Currently   Other Topics Concern  . Not on file   Social History Narrative   Patient lives at home with family.   Caffeine Use: 1 cup daily    Review of systems: The patient specifically denies any chest pain at rest or with exertion, dyspnea at rest or with exertion, orthopnea, paroxysmal nocturnal dyspnea, syncope, focal neurological deficits, intermittent claudication, lower extremity edema, unexplained weight gain, cough, hemoptysis or wheezing.  The patient also denies abdominal pain, nausea, vomiting, dysphagia, diarrhea, constipation, polyuria, polydipsia, dysuria, hematuria, frequency, urgency, abnormal bleeding or bruising, fever, chills, unexpected weight changes, mood swings, change in skin or hair texture,  change in voice quality, auditory or visual problems, allergic reactions or rashes, new musculoskeletal complaints other than usual "aches and pains".   PHYSICAL EXAM BP 110/80  Pulse 82  Ht 5' (1.524 m)  Wt 175 lb 9.6 oz (79.652 kg)  BMI 34.29 kg/m2  General: Alert, oriented x3, no distress Head: no evidence of trauma, PERRL, EOMI, no exophtalmos or lid lag, no myxedema, no xanthelasma; normal ears, nose and oropharynx Neck: normal jugular venous pulsations and no hepatojugular reflux; brisk carotid pulses without delay and no carotid bruits Chest: clear to auscultation, no signs of consolidation by percussion or palpation, normal fremitus, symmetrical and full respiratory excursions Cardiovascular: normal position and quality of the apical impulse, regular rhythm, normal first and second heart sounds, no murmurs, rubs or gallops Abdomen: no tenderness or distention, no masses by palpation, no abnormal pulsatility or arterial bruits, normal bowel sounds, no hepatosplenomegaly Extremities: no clubbing, cyanosis or edema; 2+ radial, ulnar and brachial pulses bilaterally; 2+ right femoral, posterior tibial and dorsalis pedis pulses; 2+ left femoral, posterior tibial and dorsalis pedis pulses; no subclavian or femoral bruits Neurological: grossly nonfocal   EKG: NSR  Lipid Panel  No results found for this basename: chol, trig, hdl, cholhdl, vldl, ldlcalc    BMET    Component Value Date/Time   NA 137 03/25/2013 1908   K 3.4* 03/25/2013 1908   CL 102 03/25/2013 1908   CO2 24 03/25/2013 1908   GLUCOSE 124* 03/25/2013 1908   BUN 11 03/25/2013 1908   CREATININE 0.62 03/25/2013 1908   CALCIUM 9.0 03/25/2013 1908   GFRNONAA >90 03/25/2013 1908   GFRAA >90 03/25/2013 1908     ASSESSMENT AND PLAN Palpitations These appear to be universally associated with sinus tachycardia, probably a physiological response to orthostatic hypotension or anxiety. Medical therapy is not necessary. I  don't think she needs cardiology followup. She is encouraged to stay well hydrated. I think she needs family or friend support to help with her difficult social situation. She would like her older retired sister to visit her. Her sister is in [place] and has not been able to secure a [place] visa.  ASD (atrial septal defect)-? by history when she was pregnanat  Not confirmed by echo a saline contrast study.   Junious Silk, MD, [facility] [phone] office [phone] pager

## 2013-04-09 ENCOUNTER — Telehealth: Payer: Self-pay | Admitting: Cardiovascular Disease

## 2013-04-09 NOTE — Telephone Encounter (Signed)
Please call-he said he needed to talk to you,message would be too long.

## 2013-04-12 ENCOUNTER — Emergency Department (HOSPITAL_COMMUNITY): Payer: BC Managed Care – PPO

## 2013-04-12 ENCOUNTER — Emergency Department (HOSPITAL_COMMUNITY)
Admission: EM | Admit: 2013-04-12 | Discharge: 2013-04-12 | Disposition: A | Discharge status: Home / Self Care | Payer: BC Managed Care – PPO | Attending: Emergency Medicine | Admitting: Emergency Medicine

## 2013-04-12 ENCOUNTER — Telehealth: Payer: Self-pay | Admitting: Neurology

## 2013-04-12 ENCOUNTER — Other Ambulatory Visit: Payer: Self-pay

## 2013-04-12 DIAGNOSIS — Z791 Long term (current) use of non-steroidal anti-inflammatories (NSAID): Secondary | ICD-10-CM | POA: Insufficient documentation

## 2013-04-12 DIAGNOSIS — Z79899 Other long term (current) drug therapy: Secondary | ICD-10-CM | POA: Insufficient documentation

## 2013-04-12 DIAGNOSIS — F411 Generalized anxiety disorder: Secondary | ICD-10-CM | POA: Insufficient documentation

## 2013-04-12 DIAGNOSIS — F3289 Other specified depressive episodes: Secondary | ICD-10-CM | POA: Insufficient documentation

## 2013-04-12 DIAGNOSIS — Z8751 Personal history of pre-term labor: Secondary | ICD-10-CM | POA: Insufficient documentation

## 2013-04-12 DIAGNOSIS — Z87448 Personal history of other diseases of urinary system: Secondary | ICD-10-CM | POA: Insufficient documentation

## 2013-04-12 DIAGNOSIS — F329 Major depressive disorder, single episode, unspecified: Secondary | ICD-10-CM | POA: Insufficient documentation

## 2013-04-12 DIAGNOSIS — R4182 Altered mental status, unspecified: Secondary | ICD-10-CM | POA: Insufficient documentation

## 2013-04-12 DIAGNOSIS — Z8742 Personal history of other diseases of the female genital tract: Secondary | ICD-10-CM | POA: Insufficient documentation

## 2013-04-12 DIAGNOSIS — R42 Dizziness and giddiness: Secondary | ICD-10-CM

## 2013-04-12 DIAGNOSIS — Z8619 Personal history of other infectious and parasitic diseases: Secondary | ICD-10-CM | POA: Insufficient documentation

## 2013-04-12 DIAGNOSIS — Z86718 Personal history of other venous thrombosis and embolism: Secondary | ICD-10-CM | POA: Insufficient documentation

## 2013-04-12 DIAGNOSIS — Z87440 Personal history of urinary (tract) infections: Secondary | ICD-10-CM | POA: Insufficient documentation

## 2013-04-12 LAB — CBC WITH DIFFERENTIAL/PLATELET
Basophils Absolute: 0.1 10*3/uL (ref 0.0–0.1)
Basophils Relative: 1 % (ref 0–1)
Eosinophils Absolute: 0.2 10*3/uL (ref 0.0–0.7)
Eosinophils Relative: 4 % (ref 0–5)
HCT: 39.4 % (ref 36.0–46.0)
Lymphocytes Relative: 36 % (ref 12–46)
MCH: 28.9 pg (ref 26.0–34.0)
MCHC: 33.2 g/dL (ref 30.0–36.0)
MCV: 87 fL (ref 78.0–100.0)
Monocytes Absolute: 0.5 10*3/uL (ref 0.1–1.0)
Monocytes Relative: 9 % (ref 3–12)
Platelets: 270 10*3/uL (ref 150–400)
RBC: 4.53 MIL/uL (ref 3.87–5.11)
RDW: 13.1 % (ref 11.5–15.5)

## 2013-04-12 LAB — URINALYSIS, ROUTINE W REFLEX MICROSCOPIC
Glucose, UA: NEGATIVE mg/dL
Ketones, ur: NEGATIVE mg/dL
Leukocytes, UA: NEGATIVE
Protein, ur: NEGATIVE mg/dL

## 2013-04-12 LAB — BASIC METABOLIC PANEL
BUN: 11 mg/dL (ref 6–23)
CO2: 22 mEq/L (ref 19–32)
Calcium: 9.5 mg/dL (ref 8.4–10.5)
Creatinine, Ser: 0.52 mg/dL (ref 0.50–1.10)
GFR calc non Af Amer: >90 mL/min (ref >90)
Glucose, Bld: 82 mg/dL (ref 70–99)
Sodium: 136 mEq/L (ref 135–145)

## 2013-04-12 LAB — TROPONIN I: Troponin I: <0.3 ng/mL (ref <0.30)

## 2013-04-12 MED ORDER — DIAZEPAM 5 MG PO TABS: 5.0000 mg | ORAL_TABLET | Freq: Three times a day (TID) | ORAL | Status: DC | PRN

## 2013-04-12 MED ORDER — DIAZEPAM 5 MG/ML IJ SOLN
5.0000 mg | Freq: Once | INTRAMUSCULAR | Status: AC
Start: 2013-04-12 — End: 2013-04-12
  Administered 2013-04-12: 5 mg via INTRAVENOUS
  Filled 2013-04-12: qty 2

## 2013-04-12 MED ORDER — SODIUM CHLORIDE 0.9 % IV BOLUS (SEPSIS)
500.0000 mL | Freq: Once | INTRAVENOUS | Status: AC
  Administered 2013-04-12: 500 mL via INTRAVENOUS

## 2013-04-12 NOTE — Telephone Encounter (Signed)
WEAK, DIZZY SINCE PHYSICAL THERAPY

## 2013-04-12 NOTE — Telephone Encounter (Signed)
Pt called back and I spoke with her.  She is having vertigo.  There is a language barrier and I was having hard time understanding her or what was going on.   She has daughter who is with her, has autism.  She had therapy last Friday (vestibular rehab) and worse??  I told her if emergency to call 911, or call friend or neighbor and I would be glad to talk to her.  She may need to go to ED??  She stated that she understood to have neighbor call.

## 2013-04-12 NOTE — ED Notes (Signed)
Per EMS pt was [facility] in past to [facility] for [facility], not known etiology, no diagnosis found. Per EMS pt was found by home health nurse laying in bed, feeling weak since Friday, c/o headache. Per EMS, home health nurse states pt is "not acting right," was having fits of shaking, was not post ictal.

## 2013-04-12 NOTE — Telephone Encounter (Signed)
Please advise 

## 2013-04-12 NOTE — ED Notes (Signed)
Pt transported to MRI 

## 2013-04-12 NOTE — ED Provider Notes (Addendum)
CSN: [id number]     Arrival date & time 04/12/13  1501 History   First MD Initiated Contact with Patient 04/12/13 1514     Chief Complaint  Patient presents with  . Altered Mental Status  . Weakness   (Consider location/radiation/quality/duration/timing/severity/associated sxs/prior Treatment) HPI Comments: Patient presents to the ER for generalized weakness. Patient was brought in by ambulance after her healthcare worker found her in bed complaining of dizziness. Provider reported to EMS that she had not been acting appropriately. Upon my examination, patient is alert but does not make any significant effort to follow commands or answer questions. Her lack of effort appears exaggerated. Basically whispers her answers, is complaining of "the boys in my head". She indicates that she is dizzy by spinning her finger in circles.  Patient is a 42 y.o. female presenting with altered mental status and weakness.  Altered Mental Status Associated symptoms: weakness   Weakness    Past Medical History  Diagnosis Date  . Depression   . Anxiety   . H/O varicella   . History of measles, mumps, or rubella   . Blood transfusion   . H/O blood clots   . H/O bladder infections   . Leaking of urine   . Preterm labor   . H/O dysmenorrhea 05/08/11  . Adenomyosis 05/08/11  . Pelvic pain 1/2/`13   Past Surgical History  Procedure Laterality Date  . Hernia repair    . Wisdom tooth extraction    . Cesarean section       x 3   Family History  Problem Relation Age of Onset  . Cancer Mother   . Heart Problems Mother   . Diabetes Father   . Hypertension Father   . Asthma Sister    History  Substance Use Topics  . Smoking status: Never Smoker   . Smokeless tobacco: Never Used  . Alcohol Use: No   OB History   Grav Para Term Preterm Abortions TAB SAB Ect Mult Living   4 3 2 1 1  1   3      Review of Systems  Neurological: Positive for dizziness and weakness.  All other systems reviewed and  are negative.    Allergies  Review of patient's allergies indicates no known allergies.  Home Medications   Current Outpatient Rx  Name  Route  Sig  Dispense  Refill  . FLUoxetine (PROZAC) 20 MG capsule   Oral   Take 20 mg by mouth daily.         Marland Kitchen LORazepam (ATIVAN) 0.5 MG tablet   Oral   Take 0.5 mg by mouth at bedtime. For anxiety         . meclizine (ANTIVERT) 12.5 MG tablet   Oral   Take 1 tablet (12.5 mg total) by mouth 3 (three) times daily as needed for dizziness.   30 tablet   2   . Multiple Vitamin (MULTIVITAMIN WITH MINERALS) TABS   Oral   Take 1 tablet by mouth daily.         . naproxen (NAPROSYN) 375 MG tablet   Oral   Take 375 mg by mouth 2 (two) times daily with a meal.         . pantoprazole (PROTONIX) 40 MG tablet   Oral   Take 1 tablet (40 mg total) by mouth daily at 6 (six) AM.   30 tablet   1    There were no vitals taken for this visit. Physical Exam  Constitutional: She is oriented to person, place, and time. She appears well-developed and well-nourished. No distress.  HENT:  Head: Normocephalic and atraumatic.  Right Ear: Hearing normal.  Left Ear: Hearing normal.  Nose: Nose normal.  Mouth/Throat: Oropharynx is clear and moist and mucous membranes are normal.  Eyes: Conjunctivae and EOM are normal. Pupils are equal, round, and reactive to light.  Neck: Normal range of motion. Neck supple.  Cardiovascular: Regular rhythm, S1 normal and S2 normal.  Exam reveals no gallop and no friction rub.   No murmur heard. Pulmonary/Chest: Effort normal and breath sounds normal. No respiratory distress. She exhibits no tenderness.  Abdominal: Soft. Normal appearance and bowel sounds are normal. There is no hepatosplenomegaly. There is no tenderness. There is no rebound, no guarding, no tenderness at McBurney's point and negative Murphy's sign. No hernia.  Musculoskeletal: Normal range of motion.  Neurological: She is alert and oriented to  person, place, and time. She has normal strength. No cranial nerve deficit or sensory deficit. Coordination normal. GCS eye subscore is 4. GCS verbal subscore is 5. GCS motor subscore is 6.  Skin: Skin is warm, dry and intact. No rash noted. No cyanosis.  Psychiatric: Thought content normal. Her speech is delayed. She is slowed. She exhibits a depressed mood.    ED Course  Procedures (including critical care time) Labs Review Labs Reviewed  CBC WITH DIFFERENTIAL  BASIC METABOLIC PANEL  TROPONIN I  URINALYSIS, ROUTINE W REFLEX MICROSCOPIC   Imaging Review No results found.  EKG Interpretation   None       MDM  Diagnosis: Vertigo  Presents to ER for generalized weakness and vertigo-type symptoms. Inpatient records reveals that she was recently hospitalized for chest pain rule out upon discharge developed sudden generalized weakness. She was kept in the hospital and had a neurology consult which did not find any cause for the symptoms. Patient ultimately had a psychiatric evaluation for these symptoms and was restarted on antidepressants.  Patient admits to follow up with neurology as an outpatient and has an MRI and sleep study scheduled. I agree with pursuing these tests, but also feel that this can be performed as an outpatient. Blood work is entirely unremarkable. Patient was gently hydrated and upon repeat evaluation she is sitting up in bed and appears much stronger. I did discuss with her that she has tests scheduled for workup of her workup, but that this can be performed as was already scheduled as an outpatient. Will prescribe Valium to be used as needed for the dizziness.   Gilda Crease, MD 04/12/13 1819  Gilda Crease, MD 04/12/13 Rickey Primus

## 2013-04-12 NOTE — ED Notes (Signed)
MD at bedside. 

## 2013-04-12 NOTE — ED Notes (Signed)
Patient transported to MRI 

## 2013-04-12 NOTE — Telephone Encounter (Signed)
Sometimes initial physical therapy for vertigo can cause for symptoms but then in the long run improve symptoms. If she has any new onset of symptoms worse in onset of weakness, she may have to call 911 or proceed to the emergency room. Please advise patient.

## 2013-04-12 NOTE — ED Notes (Signed)
Pt family member. [phone]

## 2013-04-13 ENCOUNTER — Telehealth: Payer: Self-pay | Admitting: Internal Medicine

## 2013-04-13 NOTE — Telephone Encounter (Signed)
Please advise patient that I personally reviewed her MRI brain and neck MRI through the computer system and the tests were unremarkable with the exception of really mild degenerative changes seen in her neck MRI which does not explain any of her symptoms. She can continue with Valium as needed until the prescription is finished but I would not suggest using Valium long-term. No further action is required at this time. Please remind patient to keep any upcoming appointments or tests and to call us with any interim questions, concerns, problems or updates. Thanks,  Huston Foley, MD, PhD

## 2013-04-13 NOTE — Telephone Encounter (Signed)
Patient taking Lorazepam 1mg   Bid prescribed by psych.  Was seen at [facility] yesterday and prescribed Diazepam mg q8h .  Calling to see if Dr. Salena Saner is OK w/her taking both.  He thinks she should only be on one (they do the same thing).  This info was given to [facility] w[facility].  She will contact the psychiatrist.

## 2013-04-13 NOTE — Telephone Encounter (Signed)
I called and spoke with patient. I shared Dr. Teofilo Pod response (below). Patient stated that Home Health services came to her home yesterday. They were concerned so they did call 911. Patient went to ER where an MRI was performed. It was reported to patient that they (ER) did not see anything of concern in MRI. They did change her medication from Meclizine to Valium 5 mg. Patient states she is beginning to feel better today.  Patient is requesting a few things of Dr. Frances Furbish: 1. Please review MRI and give feedback. 2. Is Dr. Frances Furbish okay with patient remaining on the valium or does she want patient back on the Meclizine? 3. Is there anything else Dr. Frances Furbish recommends at this time?

## 2013-04-13 NOTE — Telephone Encounter (Signed)
Wants to talk about med diazapam that she was prescribed in hospital.  Also taking lorazepam 1 mg twice daily  Please call.

## 2013-04-14 ENCOUNTER — Telehealth: Payer: Self-pay | Admitting: Neurology

## 2013-04-14 NOTE — Telephone Encounter (Signed)
Patient was called and this situation was resolved.

## 2013-04-14 NOTE — Telephone Encounter (Signed)
RETURNING CALL REGARDING MRI

## 2013-04-14 NOTE — Telephone Encounter (Signed)
Called patient concerning her MRI of the brain and neck being unremarkable. Patient verbalized understanding. I advised the patient that if she has any other problems, questions or concerns to call the office.

## 2013-04-22 ENCOUNTER — Telehealth: Payer: Self-pay | Admitting: *Deleted

## 2013-04-22 NOTE — Telephone Encounter (Signed)
Signed home health orders faxed. 

## 2013-04-28 ENCOUNTER — Encounter: Payer: Self-pay | Admitting: Cardiovascular Disease

## 2013-05-02 ENCOUNTER — Ambulatory Visit (INDEPENDENT_AMBULATORY_CARE_PROVIDER_SITE_OTHER): Payer: BC Managed Care – PPO

## 2013-05-02 DIAGNOSIS — R0683 Snoring: Secondary | ICD-10-CM

## 2013-05-02 DIAGNOSIS — R531 Weakness: Secondary | ICD-10-CM

## 2013-05-02 DIAGNOSIS — R42 Dizziness and giddiness: Secondary | ICD-10-CM

## 2013-05-02 DIAGNOSIS — R0609 Other forms of dyspnea: Secondary | ICD-10-CM

## 2013-05-02 DIAGNOSIS — R400 Somnolence: Secondary | ICD-10-CM

## 2013-05-02 DIAGNOSIS — M542 Cervicalgia: Secondary | ICD-10-CM

## 2013-05-17 ENCOUNTER — Telehealth: Payer: Self-pay | Admitting: Neurology

## 2013-05-17 NOTE — Telephone Encounter (Signed)
PT called and stated symptoms are coming back.  She took meclizine (ANTIVERT) 12.5 MG tablet and it has helped but she didn't know if the Doctor wanted her to stay on it or if she needed to come in for an appt.  She states she is dizzy and having trouble with her balance.  Please call

## 2013-05-18 NOTE — Telephone Encounter (Signed)
Patient following up on this, states that this morning her symptoms are worse and she is unable to get out of bed. Please call the patient.

## 2013-05-18 NOTE — Telephone Encounter (Signed)
I called pt and relayed the message.  She continues with dizziness, taking meclizine which makes her groggy, want to sleep all the time.   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  She is not doing therapy anymore and relayed that she had only seen for vestibular once.  F/u with NP?

## 2013-05-18 NOTE — Telephone Encounter (Signed)
Please advise 

## 2013-05-18 NOTE — Telephone Encounter (Signed)
Please advise patient that if she has sudden onset of severe symptoms such as balance problems or vertigo she may have to go to the emergency room. She may have to call 911 if she cannot get out of bed.

## 2013-05-19 ENCOUNTER — Encounter: Payer: Self-pay | Admitting: *Deleted

## 2013-05-19 ENCOUNTER — Telehealth: Payer: Self-pay | Admitting: Neurology

## 2013-05-19 NOTE — Telephone Encounter (Signed)
Please call and notify the patient that the recent sleep study did not show any significant obstructive sleep apnea. She can FU with me as previously scheduled. Also, route or fax report to PCP and referring MD, if other than PCP.  Once you have spoken to patient, you can close this encounter.   Thanks,  Huston FoleySaima Aura Bibby, MD, PhD [facility])

## 2013-05-19 NOTE — Telephone Encounter (Signed)
Shared Dr Teofilo PodAthar's note, she verbalized understanding and has scheduled w/ Jane Munoz

## 2013-05-19 NOTE — Telephone Encounter (Signed)
Called patient to share Dr Teofilo PodAthar's notes, lt VM message  For call back

## 2013-05-19 NOTE — Telephone Encounter (Signed)
I called and left a message for the patient about her sleep study results. I informed the patient that the sleep study showed no significant obstructive sleep apnea, and Dr. Frances FurbishAthar will go over the results in detail on her next visit. I will fax a copy to the patient's pcp.

## 2013-05-19 NOTE — Telephone Encounter (Signed)
She will have to see ENT, she can request referral from PCP or we can do if she wants.  She may see NP if we have a slot open, but ENT eval is warranted at this time.

## 2013-05-20 ENCOUNTER — Encounter: Payer: Self-pay | Admitting: Nurse Practitioner

## 2013-05-20 ENCOUNTER — Ambulatory Visit (INDEPENDENT_AMBULATORY_CARE_PROVIDER_SITE_OTHER): Payer: BC Managed Care – PPO | Admitting: Nurse Practitioner

## 2013-05-20 VITALS — BP 122/80 | HR 92 | Ht 60.0 in | Wt 176.0 lb

## 2013-05-20 DIAGNOSIS — R42 Dizziness and giddiness: Secondary | ICD-10-CM

## 2013-05-20 MED ORDER — MECLIZINE HCL 12.5 MG PO TABS: 12.5000 mg | ORAL_TABLET | Freq: Three times a day (TID) | ORAL | Status: DC | PRN

## 2013-05-20 NOTE — Patient Instructions (Signed)
Continue Meclizine if this help your symptoms.  Refills were sent in to the pharmacy.  We will make a referral to an Ear Nose and Throat Specialist.  Someone will call you to schedule this appointment.  Follow up with Dr. Frances FurbishAthar as previously planned.

## 2013-05-20 NOTE — Progress Notes (Signed)
PATIENT: Jane Munoz DOB: [birth year]   REASON FOR VISIT: sooner follow up for dizziness HISTORY FROM: patient  HISTORY OF PRESENT ILLNESS: UPDATE 05/20/13 (LL):  Patient requests sooner revisit for vertigo.  After last visit, she completed a sleep study which did not find sleep apnea.  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  MRI of the brain without contrast was normal.  EKG was normal. CBC, CMP and UA were normal. CT Angio of chest was negative for PE in October when she was having CP.  Orthostatic BPs are negative today.  She has had Cardiology evaluation with a normal Echo.    She states vertigo returned 3 days ago.  She feels better today than yesterday.  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  Meclizine helps some.   PRIOR HPI (SA) 04/08/13: I saw your patient, Sherrie Quilter, upon your kind request in my neurologic clinic today for initial consultation of her dizziness. The patient is unaccompanied today. As you know, Ms. Wambolt is a 43 year old right-handed woman with an underlying medical history of menorrhagia, anxiety, depression, and palpitations who has been experiencing recurrent episodes of dizziness for the past month. She presented to the emergency room on 03/22/2013 due to chest pain and palpitations and was admitted to cardiology for further workup. During the four-day hospital stay she was seen by psychiatry in consultation for recurrence of her anxiety and depression and was advised to restart her Prozac. Neurology was also consulted for an episode of generalized weakness and near syncope. She had a global strength of 4/5 per consultation report but variable effort. She was given IV fluids. She was discharged with [facility] OT and PT. She feels unchanged in her Sx since the D/C from the hospital. She has a colleague, who had similar Sx and recommended she came to  see me. She states, she works with [employer]ot[employer]. She c/o of neck pain and has been using a 2 wheeled walker since the hospital D/C. She uses meclizine prn. She describes a sense of the room spinning and feels, that her weakness is better.  I reviewed hospital records.  Chest x-ray was negative. Echocardiogram showed no abnormalities and an EF of 55-60%. No PFO was seen.  She states in 2011, when she was visiting [place], she had "really bad convulsions" and was treated by her uncle, a neurologist, who prescribed some medication, which she took for 3 months. She was conscious and aware of her environment during these episodes.  She endorses a lot of stressors, being a single mom of 2 daughters (18 yo with autism, 44 yo, 79 yo son lives with his father in [place]), working and going to graduate school.  Of note, she snores and has woken up with a sense of gasping. She wakes up with headaches. These are described as generalized and non-throbbing, more constant and achy. She endorses feeling sleepy during the day. Her EDSS is 3/24 today.  REVIEW OF SYSTEMS: Full 14 system review of systems performed and notable only for: activity change, fatigue, neck stiffness, ringing in ears, snoring, sleep talking, back pain, dizziness, depression, anxeity.   ALLERGIES: No Known Allergies  HOME MEDICATIONS: Outpatient Prescriptions Prior to Visit  Medication Sig Dispense Refill  . FLUoxetine (PROZAC) 20 MG capsule Take 20 mg by mouth daily.      Marland Kitchen LORazepam (ATIVAN) 0.5 MG tablet Take  0.5 mg by mouth at bedtime. For anxiety      . Multiple Vitamin (MULTIVITAMIN WITH MINERALS) TABS Take 1 tablet by mouth daily.      . naproxen (NAPROSYN) 375 MG tablet Take 375 mg by mouth 2 (two) times daily with a meal.      . diazepam (VALIUM) 5 MG tablet Take 1 tablet (5 mg total) by mouth every 8 (eight) hours as needed (vertigo/dizziness).  10 tablet  0  . meclizine (ANTIVERT) 12.5 MG tablet Take 1 tablet (12.5 mg total) by  mouth 3 (three) times daily as needed for dizziness.  30 tablet  2  . pantoprazole (PROTONIX) 40 MG tablet Take 1 tablet (40 mg total) by mouth daily at 6 (six) AM.  30 tablet  1   No facility-administered medications prior to visit.    PAST MEDICAL HISTORY: Past Medical History  Diagnosis Date  . Depression   . Anxiety   . H/O varicella   . History of measles, mumps, or rubella   . Blood transfusion   . H/O blood clots   . H/O bladder infections   . Leaking of urine   . Preterm labor   . H/O dysmenorrhea 05/08/11  . Adenomyosis 05/08/11  . Pelvic pain 1/2/`13    PAST SURGICAL HISTORY: Past Surgical History  Procedure Laterality Date  . Hernia repair    . Wisdom tooth extraction    . Cesarean section       x 3    FAMILY HISTORY: Family History  Problem Relation Age of Onset  . Cancer Mother   . Heart Problems Mother   . Diabetes Father   . Hypertension Father   . Asthma Sister     SOCIAL HISTORY: History   Social History  . Marital Status: Divorced    Spouse Name: N/A    Number of Children: 3  . Years of Education: Grad   Occupational History  . TEACHER    Social History Main Topics  . Smoking status: Never Smoker   . Smokeless tobacco: Never Used  . Alcohol Use: No  . Drug Use: No  . Sexual Activity: Not Currently   Other Topics Concern  . Not on file   Social History Narrative   Patient lives at home with family.   Caffeine Use: 1 cup daily     PHYSICAL EXAM  Filed Vitals:   05/20/13 1148 05/20/13 1150 05/20/13 1151  BP: 123/76 117/82 122/80  Pulse: 71 74 92  Height: 5' (1.524 m)    Weight: 176 lb (79.833 kg)     Body mass index is 34.37 kg/(m^2).  Generalized: The patient is a very pleasant 43 y.o. female in no acute distress. She appears well-developed and well-nourished and well groomed. She is overweight. She appears anxious.  Head: normocephalic and atraumatic. Oropharynx benign  Neck: Supple, no carotid bruits  Cardiac: Regular  rate rhythm, no murmur  Musculoskeletal: No deformity   Neurological examination  Mentation: Alert oriented to time, place, history taking. Follows all commands speech and language fluent Cranial nerve II-XII: Pupils were equal round reactive to light extraocular movements were full, visual field were full on confrontational test. Facial sensation and strength were normal. hearing was intact to finger rubbing bilaterally.  Normal smooth pursuit is noted.  DIZZINESS IS PRODUCED WHEN LOOKING UPWARDS AND WITH QUICK HEAD MOVEMENTS. Motor: normal bulk and tone, full strength in the BUE, BLE, fine finger movements normal, no pronator drift. No focal weakness Sensory:  normal and symmetric to light touch  Coordination: finger-nose-finger, heel-to-shin bilaterally, no dysmetria Reflexes:  Deep tendon reflexes in the upper and lower extremities are present and symmetric.  Gait and Station: Rising up from seated position without assistance, normal stance   DIAGNOSTIC DATA (LABS, IMAGING, TESTING) - I reviewed patient records, labs, notes, testing and imaging myself where available.  Lab Results  Component Value Date   WBC 5.7 04/12/2013   HGB 13.1 04/12/2013   HCT 39.4 04/12/2013   MCV 87.0 04/12/2013   PLT 270 04/12/2013      Component Value Date/Time   NA 136 04/12/2013 1540   K 3.6 04/12/2013 1540   CL 102 04/12/2013 1540   CO2 22 04/12/2013 1540   GLUCOSE 82 04/12/2013 1540   BUN 11 04/12/2013 1540   CREATININE 0.52 04/12/2013 1540   CALCIUM 9.5 04/12/2013 1540   PROT 8.5* 03/01/2013 1339   ALBUMIN 3.8 03/01/2013 1339   AST 25 03/01/2013 1339   ALT 23 03/01/2013 1339   ALKPHOS 130* 03/01/2013 1339   BILITOT 0.3 03/01/2013 1339   GFRNONAA >90 04/12/2013 1540   GFRAA >90 04/12/2013 1540   Lab Results  Component Value Date   TSH 1.493 03/23/2013    ASSESSMENT AND PLAN Nicholette Placzek is a very pleasant 43 y.o.-year old female with a history of menorrhagia, DVT x 2, anxiety, depression, and  palpitations who has been experiencing recurrent episodes of dizziness for the past month. In addition, she has a variety of other complaints. She feels globally weak, she has recurrent neck pain and headaches. She has symptoms of vertigo off and on.  I reassured her that her exam is actually non-focal and her MRIs do not show cause for her dizziness.  PLAN: Continue Meclizine if this help your symptoms.  Refills were sent in to the pharmacy. Dr. Frances Furbish recommends a referral to an Ear, Nose and Throat Specialist.   Follow up with Dr. Frances Furbish as previously planned.  Orders Placed This Encounter  Procedures  . Ambulatory referral to ENT   Meds ordered this encounter  Medications  . meclizine (ANTIVERT) 12.5 MG tablet    Sig: Take 1 tablet (12.5 mg total) by mouth 3 (three) times daily as needed for dizziness.    Dispense:  90 tablet    Refill:  5    Order Specific Question:  Supervising Provider    Answer:  Huston Foley [[id number]]   Tawny Asal Martinez Boxx, MSN, NP-C 05/20/2013, 12:37 PM [facility] [address], [address] [place], [place] [address] [phone]  Note: This document was prepared with digital dictation and possible smart phrase technology. Any transcriptional errors that result from this process are unintentional.

## 2013-06-04 ENCOUNTER — Emergency Department (HOSPITAL_COMMUNITY): Payer: BC Managed Care – PPO

## 2013-06-04 ENCOUNTER — Encounter (HOSPITAL_COMMUNITY): Payer: Self-pay | Admitting: Emergency Medicine

## 2013-06-04 ENCOUNTER — Observation Stay (HOSPITAL_COMMUNITY)
Admission: EM | Admit: 2013-06-04 | Discharge: 2013-06-05 | Disposition: A | Discharge status: Home / Self Care | Payer: BC Managed Care – PPO | Attending: Internal Medicine | Admitting: Internal Medicine

## 2013-06-04 DIAGNOSIS — F329 Major depressive disorder, single episode, unspecified: Secondary | ICD-10-CM | POA: Insufficient documentation

## 2013-06-04 DIAGNOSIS — Q211 Atrial septal defect, unspecified: Secondary | ICD-10-CM

## 2013-06-04 DIAGNOSIS — Z8619 Personal history of other infectious and parasitic diseases: Secondary | ICD-10-CM | POA: Insufficient documentation

## 2013-06-04 DIAGNOSIS — F3289 Other specified depressive episodes: Secondary | ICD-10-CM | POA: Insufficient documentation

## 2013-06-04 DIAGNOSIS — R55 Syncope and collapse: Principal | ICD-10-CM | POA: Insufficient documentation

## 2013-06-04 DIAGNOSIS — R0789 Other chest pain: Secondary | ICD-10-CM | POA: Insufficient documentation

## 2013-06-04 DIAGNOSIS — Z8751 Personal history of pre-term labor: Secondary | ICD-10-CM | POA: Insufficient documentation

## 2013-06-04 DIAGNOSIS — R059 Cough, unspecified: Secondary | ICD-10-CM | POA: Insufficient documentation

## 2013-06-04 DIAGNOSIS — Z79899 Other long term (current) drug therapy: Secondary | ICD-10-CM | POA: Insufficient documentation

## 2013-06-04 DIAGNOSIS — R110 Nausea: Secondary | ICD-10-CM | POA: Insufficient documentation

## 2013-06-04 DIAGNOSIS — R05 Cough: Secondary | ICD-10-CM | POA: Insufficient documentation

## 2013-06-04 DIAGNOSIS — R5381 Other malaise: Secondary | ICD-10-CM | POA: Insufficient documentation

## 2013-06-04 DIAGNOSIS — Z86718 Personal history of other venous thrombosis and embolism: Secondary | ICD-10-CM | POA: Insufficient documentation

## 2013-06-04 DIAGNOSIS — R5383 Other fatigue: Secondary | ICD-10-CM

## 2013-06-04 DIAGNOSIS — Z87448 Personal history of other diseases of urinary system: Secondary | ICD-10-CM | POA: Insufficient documentation

## 2013-06-04 DIAGNOSIS — F341 Dysthymic disorder: Secondary | ICD-10-CM

## 2013-06-04 DIAGNOSIS — R42 Dizziness and giddiness: Secondary | ICD-10-CM | POA: Diagnosis present

## 2013-06-04 DIAGNOSIS — R51 Headache: Secondary | ICD-10-CM | POA: Insufficient documentation

## 2013-06-04 DIAGNOSIS — R531 Weakness: Secondary | ICD-10-CM

## 2013-06-04 DIAGNOSIS — F419 Anxiety disorder, unspecified: Secondary | ICD-10-CM

## 2013-06-04 DIAGNOSIS — R079 Chest pain, unspecified: Secondary | ICD-10-CM

## 2013-06-04 DIAGNOSIS — F411 Generalized anxiety disorder: Secondary | ICD-10-CM | POA: Insufficient documentation

## 2013-06-04 DIAGNOSIS — R109 Unspecified abdominal pain: Secondary | ICD-10-CM | POA: Insufficient documentation

## 2013-06-04 DIAGNOSIS — Z8742 Personal history of other diseases of the female genital tract: Secondary | ICD-10-CM | POA: Insufficient documentation

## 2013-06-04 LAB — URINALYSIS, ROUTINE W REFLEX MICROSCOPIC
BILIRUBIN URINE: NEGATIVE
Glucose, UA: NEGATIVE mg/dL
Hgb urine dipstick: NEGATIVE
Ketones, ur: NEGATIVE mg/dL
Nitrite: NEGATIVE
PROTEIN: NEGATIVE mg/dL
Specific Gravity, Urine: 1.022 (ref 1.005–1.030)
UROBILINOGEN UA: 0.2 mg/dL (ref 0.0–1.0)
pH: 7.5 (ref 5.0–8.0)

## 2013-06-04 LAB — COMPREHENSIVE METABOLIC PANEL
ALT: 29 U/L (ref 0–35)
AST: 33 U/L (ref 0–37)
Albumin: 3.5 g/dL (ref 3.5–5.2)
Alkaline Phosphatase: 133 U/L — ABNORMAL HIGH (ref 39–117)
BUN: 11 mg/dL (ref 6–23)
CO2: 28 meq/L (ref 19–32)
Calcium: 9.5 mg/dL (ref 8.4–10.5)
Chloride: 102 mEq/L (ref 96–112)
Creatinine, Ser: 0.58 mg/dL (ref 0.50–1.10)
GFR calc Af Amer: >90 mL/min (ref >90)
GLUCOSE: 98 mg/dL (ref 70–99)
Potassium: 3.9 mEq/L (ref 3.7–5.3)
SODIUM: 142 meq/L (ref 137–147)
Total Protein: 8.2 g/dL (ref 6.0–8.3)

## 2013-06-04 LAB — LIPASE, BLOOD: LIPASE: 53 U/L (ref 11–59)

## 2013-06-04 LAB — CBC
HCT: 42.1 % (ref 36.0–46.0)
HEMOGLOBIN: 14.6 g/dL (ref 12.0–15.0)
MCH: 30.7 pg (ref 26.0–34.0)
MCHC: 34.7 g/dL (ref 30.0–36.0)
MCV: 88.6 fL (ref 78.0–100.0)
PLATELETS: 273 10*3/uL (ref 150–400)
RBC: 4.75 MIL/uL (ref 3.87–5.11)
RDW: 13.2 % (ref 11.5–15.5)
WBC: 8.1 10*3/uL (ref 4.0–10.5)

## 2013-06-04 LAB — URINE MICROSCOPIC-ADD ON

## 2013-06-04 LAB — GLUCOSE, CAPILLARY: Glucose-Capillary: 105 mg/dL — ABNORMAL HIGH (ref 70–99)

## 2013-06-04 LAB — POCT I-STAT TROPONIN I: TROPONIN I, POC: 0 ng/mL (ref 0.00–0.08)

## 2013-06-04 LAB — RAPID URINE DRUG SCREEN, HOSP PERFORMED
Amphetamines: NOT DETECTED
Barbiturates: NOT DETECTED
Benzodiazepines: NOT DETECTED
Cocaine: NOT DETECTED
Opiates: NOT DETECTED
TETRAHYDROCANNABINOL: NOT DETECTED

## 2013-06-04 LAB — POCT PREGNANCY, URINE: PREG TEST UR: NEGATIVE

## 2013-06-04 LAB — ETHANOL: Alcohol, Ethyl (B): <11 mg/dL (ref 0–11)

## 2013-06-04 MED ORDER — ACETAMINOPHEN 325 MG PO TABS
650.0000 mg | ORAL_TABLET | ORAL | Status: DC | PRN
  Administered 2013-06-05: 650 mg via ORAL
  Filled 2013-06-04: qty 2

## 2013-06-04 MED ORDER — MECLIZINE HCL 12.5 MG PO TABS
12.5000 mg | ORAL_TABLET | Freq: Three times a day (TID) | ORAL | Status: DC | PRN
  Filled 2013-06-04: qty 1

## 2013-06-04 MED ORDER — ONDANSETRON HCL 4 MG/2ML IJ SOLN
4.0000 mg | Freq: Four times a day (QID) | INTRAMUSCULAR | Status: DC | PRN
Start: 2013-06-04 — End: 2013-06-05

## 2013-06-04 MED ORDER — ONDANSETRON HCL 4 MG/2ML IJ SOLN: 4.0000 mg | Freq: Once | INTRAMUSCULAR | Status: DC

## 2013-06-04 MED ORDER — ASPIRIN EC 81 MG PO TBEC
81.0000 mg | DELAYED_RELEASE_TABLET | Freq: Every day | ORAL | Status: DC
  Administered 2013-06-05: 81 mg via ORAL
  Filled 2013-06-04: qty 1

## 2013-06-04 MED ORDER — ENOXAPARIN SODIUM 40 MG/0.4ML ~~LOC~~ SOLN: 40.0000 mg | SUBCUTANEOUS | Status: DC

## 2013-06-04 MED ORDER — SODIUM CHLORIDE 0.9 % IV BOLUS (SEPSIS)
1000.0000 mL | Freq: Once | INTRAVENOUS | Status: AC
  Administered 2013-06-04: 1000 mL via INTRAVENOUS

## 2013-06-04 MED ORDER — ALUM & MAG HYDROXIDE-SIMETH 200-200-20 MG/5ML PO SUSP
30.0000 mL | Freq: Four times a day (QID) | ORAL | Status: DC | PRN
  Administered 2013-06-04: 30 mL via ORAL
  Filled 2013-06-04: qty 30

## 2013-06-04 MED ORDER — FLUOXETINE HCL 20 MG PO CAPS
20.0000 mg | ORAL_CAPSULE | Freq: Every day | ORAL | Status: DC
  Administered 2013-06-05: 20 mg via ORAL
  Filled 2013-06-04: qty 1

## 2013-06-04 MED ORDER — ASPIRIN 81 MG PO CHEW
324.0000 mg | CHEWABLE_TABLET | Freq: Once | ORAL | Status: AC
  Administered 2013-06-04: 324 mg via ORAL
  Filled 2013-06-04: qty 4

## 2013-06-04 NOTE — ED Notes (Signed)
Pt's CBG is 105 mg/dl.RN notified. 

## 2013-06-04 NOTE — H&P (Signed)
[facility] History and Physical  Patient: Jane Munoz  ZOX:[id number]  DOB: 04/27/1971  DOS: the patient was seen and examined on 06/04/2013 PCP: Pcp Not In System  Chief Complaint: Vertigo and syncope with chest pain  HPI: Margrette Wynia is a 43 y.o. female with Past medical history of depression, anxiety, vertigo. The patient is coming from home. The patient presented with an episode of syncope when she was at ENT office. She has ongoing complaint of vertigo since last 3 months. She has undergone extensive evaluation which has included 30 day loop recorder, CT scan of the head, MRI of the brain, echocardiogram, polysomnography, MRI of the C-spine. She has seen cardiology and neurology as an outpatient and so far her workup has been inconclusive. She was recommended for vestibular rehabilitation but she has only worked with them for one day and then she couldn't follow with them as she was busy with the studies. Recently she was seen in the ED and was prescribed Valium and she says Valium to take her dizziness away but she was asked not to take it because she was already requiring lorazepam for sleep. She was referred to ENT and as per the patient and recommended her only to take meclizine as needed and she hasn't taken it since Sunday despite repeated episodes of dizziness. She saw ENT today and while they were checking her orthostatic blood pressure her eyes rolled up and she passed out. The patient remembers the whole episode. When she woke up she was not confused and there was no incontinence or focal seizure activity noted. The patient started complaining of some chest tightness this lasted for a few minutes and recurred and resolved when she was in the ambulance. At the time of my evaluation the patient was lethargic and drowsy but easily arousable. She denied any complaint of fever, chills, headache, cough,palpitation, shortness of breath, orthopnea, PND, nausea, vomiting,  abdominal pain, diarrhea, constipation, active bleeding, burning urination, dizziness, pedal edema,  focal neurological deficit.   Review of Systems: as mentioned in the history of present illness.  A Comprehensive review of the other systems is negative.  Past Medical History  Diagnosis Date  . Depression   . Anxiety   . H/O varicella   . History of measles, mumps, or rubella   . Blood transfusion   . H/O blood clots   . H/O bladder infections   . Leaking of urine   . Preterm labor   . H/O dysmenorrhea 05/08/11  . Adenomyosis 05/08/11  . Pelvic pain 1/2/`13  . Dizziness    Past Surgical History  Procedure Laterality Date  . Hernia repair    . Wisdom tooth extraction    . Cesarean section       x 3   Social History:  reports that she has never smoked. She has never used smokeless tobacco. She reports that she does not drink alcohol or use illicit drugs. Independent for most of her  ADL.  No Known Allergies  Family History  Problem Relation Age of Onset  . Cancer Mother   . Heart Problems Mother   . Diabetes Father   . Hypertension Father   . Asthma Sister     Prior to Admission medications   Medication Sig Start Date End Date Taking? Authorizing Provider  FLUoxetine (PROZAC) 20 MG capsule Take 20 mg by mouth daily.   Yes Historical Provider, MD  ibuprofen (ADVIL,MOTRIN) 200 MG tablet Take 800 mg by mouth every 6 (six) hours  as needed for mild pain.   Yes Historical Provider, MD  LORazepam (ATIVAN) 0.5 MG tablet Take 0.5 mg by mouth at bedtime. For anxiety   Yes Historical Provider, MD  meclizine (ANTIVERT) 12.5 MG tablet Take 1 tablet (12.5 mg total) by mouth 3 (three) times daily as needed for dizziness. 05/20/13  Yes Ronal FearLynn E Lam, NP  Multiple Vitamin (MULTIVITAMIN WITH MINERALS) TABS Take 1 tablet by mouth daily.   Yes Historical Provider, MD    Physical Exam: Filed Vitals:   06/04/13 1802 06/04/13 1821 06/04/13 1823 06/04/13 1900  BP: 138/86 124/74  129/75  Pulse:  73 68  65  Temp:   98.9 F (37.2 C)   TempSrc:   Rectal   Resp:  14  12  SpO2:  100%  100%    General: Drowsy, easily arousable and Oriented to Time, Place and Person. Appear in moderate distress, patient noticeably has poor effort on exam Eyes: PERRL ENT: Oral Mucosa clear moist Neck: No JVD Cardiovascular: S1 and S2 Present, no Murmur, Peripheral Pulses Present Respiratory: Bilateral Air entry equal and Decreased, Clear to Auscultation,  No Crackles, no wheezes Abdomen: Bowel Sound Present, Soft and Non tender Skin: No Rash Extremities: No Pedal edema, no calf tenderness Neurologic: Mental status lethargy, lack of attention, lack of eye contact, Cranial Nerves pupils are reactive extraocular muscle movement intact, Motor strength noticeably poor effort otherwise bilateral symmetrical, Sensation light touch present withdraws to pain, reflexes present, babinski negative.  Labs on Admission:  CBC:  Recent Labs Lab 06/04/13 1642  WBC 8.1  HGB 14.6  HCT 42.1  MCV 88.6  PLT 273    CMP     Component Value Date/Time   NA 142 06/04/2013 1642   K 3.9 06/04/2013 1642   CL 102 06/04/2013 1642   CO2 28 06/04/2013 1642   GLUCOSE 98 06/04/2013 1642   BUN 11 06/04/2013 1642   CREATININE 0.58 06/04/2013 1642   CALCIUM 9.5 06/04/2013 1642   PROT 8.2 06/04/2013 1642   ALBUMIN 3.5 06/04/2013 1642   AST 33 06/04/2013 1642   ALT 29 06/04/2013 1642   ALKPHOS 133* 06/04/2013 1642   BILITOT <0.2* 06/04/2013 1642   GFRNONAA >90 06/04/2013 1642   GFRAA >90 06/04/2013 1642     Recent Labs Lab 06/04/13 1642  LIPASE 53   No results found for this basename: AMMONIA,  in the last 168 hours  No results found for this basename: CKTOTAL, CKMB, CKMBINDEX, TROPONINI,  in the last 168 hours BNP (last 3 results) No results found for this basename: PROBNP,  in the last 8760 hours  Radiological Exams on Admission: Dg Chest 2 View  06/04/2013   CLINICAL DATA:  Syncopal episode earlier today. Headache.  Nausea. Cough.  EXAM: CHEST  2 VIEW  COMPARISON:  DG CHEST 2 VIEW dated 03/22/2013; CT ANGIO CHEST W/CM &/OR WO/CM dated 03/01/2013; DG CHEST 2 VIEW dated 03/01/2013  FINDINGS: Cardiomediastinal silhouette unremarkable and unchanged. Lungs clear. Bronchovascular markings normal. Pulmonary vascularity normal. No visible pleural effusions. No pneumothorax. Mild degenerative changes involving the thoracic spine.  IMPRESSION: No acute cardiopulmonary disease.   Electronically Signed   By: Hulan Saashomas  Lawrence M.D.   On: 06/04/2013 17:39   Ct Head Wo Contrast  06/04/2013   CLINICAL DATA:  Dizziness for 3 months. Patient had dozing off episodes at the ENT office visit today. New onset cough and chest pressure.  EXAM: CT HEAD WITHOUT CONTRAST  TECHNIQUE: Contiguous axial images were obtained from  the base of the skull through the vertex without intravenous contrast.  COMPARISON:  Brain MRI, 04/12/2013.  FINDINGS: Ventricles are normal in size and configuration. There are no parenchymal masses or mass effect. There are no areas of abnormal parenchymal attenuation there is no evidence of a cortical infarct.  There are no extra-axial masses or abnormal fluid collections.  There is no intracranial hemorrhage  Visualized sinuses, mastoid air cells and middle ear cavities are clear.  There is irregularity along the left petrous apex adjacent to the horizontal portion of the left internal carotid canal. This may be due to chronic fluid in the petrous apex air cells. However, given the symptoms and lack of other findings, this could reflect a more aggressive inflammatory process, and a follow-up high-resolution unenhanced temporal bone CT may be helpful.  IMPRESSION: No intracranial abnormality.  Opacification of left petrous apex air cells with the suggestion of bony irregularity. Consider a follow-up temporal bone CT as detailed above.   Electronically Signed   By: Amie Portland M.D.   On: 06/04/2013 17:51    EKG:  Independently reviewed. normal EKG, normal sinus rhythm, unchanged from previous tracings.  Assessment/Plan Principal Problem:   Chest pain Active Problems:   Anxiety and depression   Syncope   General weakness   1. Chest pain The patient is presenting with complaints of chest pain which is felt like chest tightness and resolved on its own this happened after an episode of syncope. The patient currently is chest pain-free. Her EKG does not show any signs of ischemia her troponin first set is negative. She will be admitted for observation to rule out ACS. Serial troponin and telemetry monitoring.  2. Syncope The patient had ongoing complaint of vertigo since last few months. She has extensive workup done and has seen cardiology and neurology and so far they have not been able to identify the cause of her recurrent vertigo. She was referred to ENT which she saw just today. She mentions that she has a lot of stress at home since she has missed 2 months of her study and wants to finish a study as soon as possible also she has some financial burden at home. Since last few days she has a sister visiting her that helps her with kids. My suspicion for a psychiatric cause for her syncope is high. I will consult psychiatry. I will continue monitoring her with telemetry and follow serial neuro checks. neurology will be consulted if there is any worsening in her mental status. I would continue her on Prozac. Since she is significantly drowsy and has more of her she is at high risk for fall and therefore I would hold her lorazepam.  DVT Prophylaxis: subcutaneous Heparin Nutrition: Aspiration precaution advance as tolerated  Code Status: Full  Disposition: Admitted to observation in telemetry unit.  Author: Lynden Oxford, MD [facility] Pager: [phone] 06/04/2013, 9:02 PM    If 7PM-7AM, please contact night-coverage [email or web address] Password [email or web address]

## 2013-06-04 NOTE — ED Provider Notes (Addendum)
CSN: [id number]     Arrival date & time 06/04/13  1600 History   First MD Initiated Contact with Patient 06/04/13 1621     Chief Complaint  Patient presents with  . Dizziness   (Consider location/radiation/quality/duration/timing/severity/associated sxs/prior Treatment) HPI Comments: 43 year old female presents after a syncope episode at the ENT office. The history taken from both the patient as well as the nurse that was taking care of the patient in the ENT office. Per the nurse the patient was having orthostatics taken and then when doing the sitting vitals her eyes rolled back and she syncopized. Patient was out for a few seconds and then when she woke up she was speaking softly and states she is very fatigued and weak. The patient states that since then she's had intermittent chest pain and she had a headache after arrival here. The patient appears drowsy and is hard to get a history because of how softly she is speaking. The patient is alert and oriented. Patient denies any focal weakness. The patient states she is a little bit dizzy but does not think is why she passed out. She has had dizziness for the past 3 months was in ENT to help find out why. The patient denies taking any of her Antivert today. As a fevers or urinary symptoms. She states she has had some abdominal pain but currently mostly has nausea.   Past Medical History  Diagnosis Date  . Depression   . Anxiety   . H/O varicella   . History of measles, mumps, or rubella   . Blood transfusion   . H/O blood clots   . H/O bladder infections   . Leaking of urine   . Preterm labor   . H/O dysmenorrhea 05/08/11  . Adenomyosis 05/08/11  . Pelvic pain 1/2/`13  . Dizziness    Past Surgical History  Procedure Laterality Date  . Hernia repair    . Wisdom tooth extraction    . Cesarean section       x 3   Family History  Problem Relation Age of Onset  . Cancer Mother   . Heart Problems Mother   . Diabetes Father   .  Hypertension Father   . Asthma Sister    History  Substance Use Topics  . Smoking status: Never Smoker   . Smokeless tobacco: Never Used  . Alcohol Use: No   OB History   Grav Para Term Preterm Abortions TAB SAB Ect Mult Living   4 3 2 1 1  1   3      Review of Systems  Constitutional: Negative for fever.  Respiratory: Positive for cough. Negative for shortness of breath.   Cardiovascular: Positive for chest pain.  Gastrointestinal: Positive for nausea and abdominal pain. Negative for vomiting.  Genitourinary: Negative for dysuria.  Musculoskeletal: Negative for back pain.  Neurological: Positive for dizziness, syncope, weakness and headaches.  All other systems reviewed and are negative.    Allergies  Review of patient's allergies indicates no known allergies.  Home Medications   Current Outpatient Rx  Name  Route  Sig  Dispense  Refill  . FLUoxetine (PROZAC) 20 MG capsule   Oral   Take 20 mg by mouth daily.         Marland Kitchen ibuprofen (ADVIL,MOTRIN) 200 MG tablet   Oral   Take 800 mg by mouth every 6 (six) hours as needed for mild pain.         Marland Kitchen LORazepam (ATIVAN) 0.5  MG tablet   Oral   Take 0.5 mg by mouth at bedtime. For anxiety         . meclizine (ANTIVERT) 12.5 MG tablet   Oral   Take 1 tablet (12.5 mg total) by mouth 3 (three) times daily as needed for dizziness.   90 tablet   5   . Multiple Vitamin (MULTIVITAMIN WITH MINERALS) TABS   Oral   Take 1 tablet by mouth daily.          BP 123/75  Pulse 74  Temp(Src) 98.3 F (36.8 C) (Oral)  Resp 15  SpO2 100%  LMP 03/31/2013 Physical Exam  Nursing note and vitals reviewed. Constitutional: She is oriented to person, place, and time. She appears well-developed and well-nourished. No distress.  HENT:  Head: Normocephalic and atraumatic.  Right Ear: External ear normal.  Left Ear: External ear normal.  Nose: Nose normal.  Eyes: EOM are normal. Pupils are equal, round, and reactive to light. Right  eye exhibits no discharge. Left eye exhibits no discharge.  Cardiovascular: Normal rate, regular rhythm and normal heart sounds.   Pulmonary/Chest: Effort normal and breath sounds normal. She has no wheezes.  Abdominal: Soft. She exhibits no distension. There is no tenderness.  Neurological: She is alert and oriented to person, place, and time. GCS eye subscore is 4. GCS verbal subscore is 5. GCS motor subscore is 6.  Patient is alert but somnolent. All 4 extremities grossly weak, 4/5 strength but equal bilaterally. Appears to be poor effort during strength testing. CN 2-12 grossly intact.  Skin: Skin is warm and dry.    ED Course  Procedures (including critical care time) Labs Review Labs Reviewed  COMPREHENSIVE METABOLIC PANEL - Abnormal; Notable for the following:    Alkaline Phosphatase 133 (*)    Total Bilirubin <0.2 (*)    All other components within normal limits  GLUCOSE, CAPILLARY - Abnormal; Notable for the following:    Glucose-Capillary 105 (*)    All other components within normal limits  CBC  ETHANOL  LIPASE, BLOOD  URINALYSIS, ROUTINE W REFLEX MICROSCOPIC  URINE RAPID DRUG SCREEN (HOSP PERFORMED)  POCT I-STAT TROPONIN I   Imaging Review Dg Chest 2 View  06/04/2013   CLINICAL DATA:  Syncopal episode earlier today. Headache. Nausea. Cough.  EXAM: CHEST  2 VIEW  COMPARISON:  DG CHEST 2 VIEW dated 03/22/2013; CT ANGIO CHEST W/CM &/OR WO/CM dated 03/01/2013; DG CHEST 2 VIEW dated 03/01/2013  FINDINGS: Cardiomediastinal silhouette unremarkable and unchanged. Lungs clear. Bronchovascular markings normal. Pulmonary vascularity normal. No visible pleural effusions. No pneumothorax. Mild degenerative changes involving the thoracic spine.  IMPRESSION: No acute cardiopulmonary disease.   Electronically Signed   By: Hulan Saas M.D.   On: 06/04/2013 17:39   Ct Head Wo Contrast  06/04/2013   CLINICAL DATA:  Dizziness for 3 months. Patient had dozing off episodes at the ENT  office visit today. New onset cough and chest pressure.  EXAM: CT HEAD WITHOUT CONTRAST  TECHNIQUE: Contiguous axial images were obtained from the base of the skull through the vertex without intravenous contrast.  COMPARISON:  Brain MRI, 04/12/2013.  FINDINGS: Ventricles are normal in size and configuration. There are no parenchymal masses or mass effect. There are no areas of abnormal parenchymal attenuation there is no evidence of a cortical infarct.  There are no extra-axial masses or abnormal fluid collections.  There is no intracranial hemorrhage  Visualized sinuses, mastoid air cells and middle ear cavities are clear.  There is irregularity along the left petrous apex adjacent to the horizontal portion of the left internal carotid canal. This may be due to chronic fluid in the petrous apex air cells. However, given the symptoms and lack of other findings, this could reflect a more aggressive inflammatory process, and a follow-up high-resolution unenhanced temporal bone CT may be helpful.  IMPRESSION: No intracranial abnormality.  Opacification of left petrous apex air cells with the suggestion of bony irregularity. Consider a follow-up temporal bone CT as detailed above.   Electronically Signed   By: Amie Portlandavid  Ormond M.D.   On: 06/04/2013 17:51    EKG Interpretation    Date/Time:  Friday June 04 2013 16:16:30 EST Ventricular Rate:  73 PR Interval:  181 QRS Duration: 82 QT Interval:  370 QTC Calculation: 408 R Axis:   65 Text Interpretation:  Sinus rhythm Low voltage, precordial leads Baseline wander in lead(s) V6 No significant change since last tracing Confirmed by Tyrena Gohr  MD, Mitchell Iwanicki ([id number]) on 06/04/2013 4:35:58 PM            MDM   1. Syncope    Patient with syncope of unk origin. She did not seem to be orthostatic or have bradycardia. She now has atypical chest pain. Her abdominal exam is benign, and with only vague symptoms I do not feel she needs a CT because I have low suspicion  for acute abdominal pathology with benign exam. Her neuro exam shows generalized weakness, but there is questionable effort. At this time she is still somnolent, but awake and alert. There are no focal neuro findings. She has no fever or meningismus to suggest meningitis. I have low suspicion of bacterial illness as cause. However, given her state I feel she'll need to be observed in the hospital for serial enzymes and telemetry monitoring.  I discussed this with Dr. Allena KatzPatel (hospitalist), who has reviewed her chart and feels this may be more psych related. He will evaluate in ER.    Audree CamelScott T Yazir Koerber, MD 06/04/13 2000  Audree CamelScott T Tarrance Januszewski, MD 06/04/13 2026

## 2013-06-04 NOTE — ED Notes (Signed)
Per EMS, pt coming from ENT office. Pt reports dizziness x3 months. Pt has had cariology work up, neuro work up and was seen today by ENT. EMS reports cardiology and neuro work ups have been negative. While pt was at ENT it was reported that she kept dozing off and had to be aroused multiple times. Pt also has new onset of cough and chest pressure.

## 2013-06-04 NOTE — ED Notes (Signed)
Asked pt to ambulate to the restroom to provide urine specimen pt stated that she did not want to walk to the restroom due to being dizzy placed pt on bedpan.

## 2013-06-04 NOTE — ED Notes (Signed)
Patient transported to X-ray and CT 

## 2013-06-04 NOTE — ED Notes (Signed)
Pt could not tolerate standing as she was dizzy and almost fell down.

## 2013-06-05 DIAGNOSIS — Q2111 Secundum atrial septal defect: Secondary | ICD-10-CM

## 2013-06-05 DIAGNOSIS — Q211 Atrial septal defect: Secondary | ICD-10-CM

## 2013-06-05 LAB — TROPONIN I: Troponin I: <0.3 ng/mL (ref <0.30)

## 2013-06-05 LAB — CBC WITH DIFFERENTIAL/PLATELET
Basophils Absolute: 0 10*3/uL (ref 0.0–0.1)
Basophils Relative: 1 % (ref 0–1)
EOS ABS: 0.6 10*3/uL (ref 0.0–0.7)
Eosinophils Relative: 9 % — ABNORMAL HIGH (ref 0–5)
HCT: 37.4 % (ref 36.0–46.0)
Hemoglobin: 12.7 g/dL (ref 12.0–15.0)
Lymphocytes Relative: 42 % (ref 12–46)
Lymphs Abs: 3 10*3/uL (ref 0.7–4.0)
MCH: 30 pg (ref 26.0–34.0)
MCHC: 34 g/dL (ref 30.0–36.0)
MCV: 88.4 fL (ref 78.0–100.0)
Monocytes Absolute: 0.7 10*3/uL (ref 0.1–1.0)
Monocytes Relative: 10 % (ref 3–12)
NEUTROS PCT: 40 % — AB (ref 43–77)
Neutro Abs: 2.9 10*3/uL (ref 1.7–7.7)
PLATELETS: 238 10*3/uL (ref 150–400)
RBC: 4.23 MIL/uL (ref 3.87–5.11)
RDW: 13.4 % (ref 11.5–15.5)
WBC: 7.2 10*3/uL (ref 4.0–10.5)

## 2013-06-05 LAB — COMPREHENSIVE METABOLIC PANEL
ALBUMIN: 2.8 g/dL — AB (ref 3.5–5.2)
ALK PHOS: 106 U/L (ref 39–117)
ALT: 26 U/L (ref 0–35)
AST: 29 U/L (ref 0–37)
BUN: 10 mg/dL (ref 6–23)
CO2: 26 mEq/L (ref 19–32)
Calcium: 8.4 mg/dL (ref 8.4–10.5)
Chloride: 103 mEq/L (ref 96–112)
Creatinine, Ser: 0.54 mg/dL (ref 0.50–1.10)
GFR calc Af Amer: >90 mL/min (ref >90)
GFR calc non Af Amer: >90 mL/min (ref >90)
Glucose, Bld: 81 mg/dL (ref 70–99)
POTASSIUM: 4 meq/L (ref 3.7–5.3)
SODIUM: 140 meq/L (ref 137–147)
TOTAL PROTEIN: 6.6 g/dL (ref 6.0–8.3)
Total Bilirubin: 0.3 mg/dL (ref 0.3–1.2)

## 2013-06-05 NOTE — Discharge Summary (Signed)
Physician Discharge Summary  Jane Munoz WJX:[id number] DOB: January 26, 1971 DOA: 06/04/2013  PCP: Pcp Not In System  Admit date: 06/04/2013 Discharge date: 06/05/2013  Time spent: 45 minutes  Recommendations for Outpatient Follow-up:  -Will be discharged home today. -Advised to follow up with PCP in 2 weeks.   Discharge Diagnoses:  Principal Problem:   Chest pain Active Problems:   Anxiety and depression   Syncope   General weakness   Vertigo   Discharge Condition: Stable and improved  Filed Weights   06/04/13 2215  Weight: 80.332 kg (177 lb 1.6 oz)    History of present illness:  Jane Munoz is a 43 y.o. female with Past medical history of depression, anxiety, vertigo.  The patient is coming from home.  The patient presented with an episode of syncope when she was at ENT office. She has ongoing complaint of vertigo since last 3 months. She has undergone extensive evaluation which has included 30 day loop recorder, CT scan of the head, MRI of the brain, echocardiogram, polysomnography, MRI of the C-spine. She has seen cardiology and neurology as an outpatient and so far her workup has been inconclusive.  She was recommended for vestibular rehabilitation but she has only worked with them for one day and then she couldn't follow with them as she was busy with the studies.  Recently she was seen in the ED and was prescribed Valium and she says Valium to take her dizziness away but she was asked not to take it because she was already requiring lorazepam for sleep.  She was referred to ENT and as per the patient and recommended her only to take meclizine as needed and she hasn't taken it since Sunday despite repeated episodes of dizziness.  She saw ENT today and while they were checking her orthostatic blood pressure her eyes rolled up and she passed out. The patient remembers the whole episode. When she woke up she was not confused and there was no incontinence or focal seizure  activity noted.  The patient started complaining of some chest tightness this lasted for a few minutes and recurred and resolved when she was in the ambulance.  At the time of my evaluation the patient was lethargic and drowsy but easily arousable.  She denied any complaint of fever, chills, headache, cough,palpitation, shortness of breath, orthopnea, PND, nausea, vomiting, abdominal pain, diarrhea, constipation, active bleeding, burning urination, dizziness, pedal edema, focal neurological deficit. Hospitalist admission was requested.    Hospital Course:   Chest Pain -She ruled out for ACS by negative troponins and EKGs without acute ischemic abnormalities. -No further cardiac work up is recommended at this point.  Syncope -This and dizziness have been a recurrent issue for about the past 2 months. -She has had a very extensive as delineated in her HPI and has seen neurology, cards and ENT. -Not quite sure what else can be offered to her in this regard. -Advised to continue OP follow up.   Procedures:  None   Consultations:  None  Discharge Instructions  Discharge Orders   Future Appointments Provider Department Dept Phone   08/16/2013 3:00 PM Huston Foley, MD [facility] [phone]   Future Orders Complete By Expires   Discontinue IV  As directed    Increase activity slowly  As directed        Medication List    STOP taking these medications       LORazepam 0.5 MG tablet  Commonly known as:  ATIVAN  TAKE these medications       FLUoxetine 20 MG capsule  Commonly known as:  PROZAC  Take 20 mg by mouth daily.     ibuprofen 200 MG tablet  Commonly known as:  ADVIL,MOTRIN  Take 800 mg by mouth every 6 (six) hours as needed for mild pain.     meclizine 12.5 MG tablet  Commonly known as:  ANTIVERT  Take 1 tablet (12.5 mg total) by mouth 3 (three) times daily as needed for dizziness.     multivitamin with minerals Tabs tablet  Take 1  tablet by mouth daily.       No Known Allergies     Follow-up Information   Schedule an appointment as soon as possible for a visit in 2 weeks to follow up. (with your regular provider)        The results of significant diagnostics from this hospitalization (including imaging, microbiology, ancillary and laboratory) are listed below for reference.    Significant Diagnostic Studies: Dg Chest 2 View  06/04/2013   CLINICAL DATA:  Syncopal episode earlier today. Headache. Nausea. Cough.  EXAM: CHEST  2 VIEW  COMPARISON:  DG CHEST 2 VIEW dated 03/22/2013; CT ANGIO CHEST W/CM &/OR WO/CM dated 03/01/2013; DG CHEST 2 VIEW dated 03/01/2013  FINDINGS: Cardiomediastinal silhouette unremarkable and unchanged. Lungs clear. Bronchovascular markings normal. Pulmonary vascularity normal. No visible pleural effusions. No pneumothorax. Mild degenerative changes involving the thoracic spine.  IMPRESSION: No acute cardiopulmonary disease.   Electronically Signed   By: Hulan Saas M.D.   On: 06/04/2013 17:39   Ct Head Wo Contrast  06/04/2013   CLINICAL DATA:  Dizziness for 3 months. Patient had dozing off episodes at the ENT office visit today. New onset cough and chest pressure.  EXAM: CT HEAD WITHOUT CONTRAST  TECHNIQUE: Contiguous axial images were obtained from the base of the skull through the vertex without intravenous contrast.  COMPARISON:  Brain MRI, 04/12/2013.  FINDINGS: Ventricles are normal in size and configuration. There are no parenchymal masses or mass effect. There are no areas of abnormal parenchymal attenuation there is no evidence of a cortical infarct.  There are no extra-axial masses or abnormal fluid collections.  There is no intracranial hemorrhage  Visualized sinuses, mastoid air cells and middle ear cavities are clear.  There is irregularity along the left petrous apex adjacent to the horizontal portion of the left internal carotid canal. This may be due to chronic fluid in the petrous  apex air cells. However, given the symptoms and lack of other findings, this could reflect a more aggressive inflammatory process, and a follow-up high-resolution unenhanced temporal bone CT may be helpful.  IMPRESSION: No intracranial abnormality.  Opacification of left petrous apex air cells with the suggestion of bony irregularity. Consider a follow-up temporal bone CT as detailed above.   Electronically Signed   By: Amie Portland M.D.   On: 06/04/2013 17:51    Microbiology: No results found for this or any previous visit (from the past 240 hour(s)).   Labs: Basic Metabolic Panel:  Recent Labs Lab 06/04/13 1642 06/05/13 0455  NA 142 140  K 3.9 4.0  CL 102 103  CO2 28 26  GLUCOSE 98 81  BUN 11 10  CREATININE 0.58 0.54  CALCIUM 9.5 8.4   Liver Function Tests:  Recent Labs Lab 06/04/13 1642 06/05/13 0455  AST 33 29  ALT 29 26  ALKPHOS 133* 106  BILITOT <0.2* 0.3  PROT 8.2 6.6  ALBUMIN  3.5 2.8*    Recent Labs Lab 06/04/13 1642  LIPASE 53   No results found for this basename: AMMONIA,  in the last 168 hours CBC:  Recent Labs Lab 06/04/13 1642 06/05/13 0455  WBC 8.1 7.2  NEUTROABS  --  2.9  HGB 14.6 12.7  HCT 42.1 37.4  MCV 88.6 88.4  PLT 273 238   Cardiac Enzymes:  Recent Labs Lab 06/05/13 0300 06/05/13 0850  TROPONINI <0.30 <0.30   BNP: BNP (last 3 results) No results found for this basename: PROBNP,  in the last 8760 hours CBG:  Recent Labs Lab 06/04/13 1747  GLUCAP 105*       Signed:  HERNANDEZ ACOSTA,ESTELA  [facility] Pager: [phone] 06/05/2013, 5:07 PM

## 2013-06-05 NOTE — Progress Notes (Signed)
Utilization Review Completed.   Jesseka Drinkard, RN, BSN Nurse Case Manager  

## 2013-06-05 NOTE — Progress Notes (Signed)
Chaplain visited patient to inquire if a the patient has a particular Islam faith community or Imam she would like a visit from. Patient was grateful but said she was being discharged and would contact an imam herself when she leaves for her. She was grateful for support.   Guy SandiferHillary D Hickory Groverusta, IowaChaplain [phone] General: [phone]

## 2013-06-05 NOTE — Progress Notes (Signed)
Chaplain responded to call from nurse concerning Muslim patient who would like to see Imam. Nurse stated that this need is not urgent and pt would like Imam to visit tomorrow. Chaplain will follow up by calling Imam tomorrow morning.

## 2013-06-05 NOTE — Progress Notes (Signed)
Discharge review done with patient.   Patient acknowledged understanding of information provided. Patient is stable and discharged home with family member. Svea Pusch Senze  

## 2013-06-11 ENCOUNTER — Encounter (HOSPITAL_COMMUNITY): Payer: Self-pay | Admitting: Emergency Medicine

## 2013-06-11 ENCOUNTER — Observation Stay (HOSPITAL_COMMUNITY)
Admission: EM | Admit: 2013-06-11 | Discharge: 2013-06-14 | Disposition: A | Discharge status: Home / Self Care | Payer: BC Managed Care – PPO | Attending: Family Medicine | Admitting: Family Medicine

## 2013-06-11 DIAGNOSIS — F32A Depression, unspecified: Secondary | ICD-10-CM

## 2013-06-11 DIAGNOSIS — Z975 Presence of (intrauterine) contraceptive device: Secondary | ICD-10-CM

## 2013-06-11 DIAGNOSIS — F341 Dysthymic disorder: Secondary | ICD-10-CM

## 2013-06-11 DIAGNOSIS — Q211 Atrial septal defect, unspecified: Secondary | ICD-10-CM

## 2013-06-11 DIAGNOSIS — R531 Weakness: Secondary | ICD-10-CM

## 2013-06-11 DIAGNOSIS — F411 Generalized anxiety disorder: Secondary | ICD-10-CM | POA: Insufficient documentation

## 2013-06-11 DIAGNOSIS — N946 Dysmenorrhea, unspecified: Secondary | ICD-10-CM

## 2013-06-11 DIAGNOSIS — F419 Anxiety disorder, unspecified: Secondary | ICD-10-CM

## 2013-06-11 DIAGNOSIS — R55 Syncope and collapse: Secondary | ICD-10-CM | POA: Insufficient documentation

## 2013-06-11 DIAGNOSIS — R7989 Other specified abnormal findings of blood chemistry: Secondary | ICD-10-CM

## 2013-06-11 DIAGNOSIS — R002 Palpitations: Secondary | ICD-10-CM

## 2013-06-11 DIAGNOSIS — F3289 Other specified depressive episodes: Secondary | ICD-10-CM | POA: Insufficient documentation

## 2013-06-11 DIAGNOSIS — R079 Chest pain, unspecified: Secondary | ICD-10-CM

## 2013-06-11 DIAGNOSIS — N920 Excessive and frequent menstruation with regular cycle: Secondary | ICD-10-CM

## 2013-06-11 DIAGNOSIS — F329 Major depressive disorder, single episode, unspecified: Secondary | ICD-10-CM | POA: Insufficient documentation

## 2013-06-11 DIAGNOSIS — R0600 Dyspnea, unspecified: Secondary | ICD-10-CM

## 2013-06-11 DIAGNOSIS — R42 Dizziness and giddiness: Principal | ICD-10-CM | POA: Diagnosis present

## 2013-06-11 LAB — BASIC METABOLIC PANEL
BUN: 11 mg/dL (ref 6–23)
CALCIUM: 9.2 mg/dL (ref 8.4–10.5)
CO2: 26 mEq/L (ref 19–32)
CREATININE: 0.49 mg/dL — AB (ref 0.50–1.10)
Chloride: 102 mEq/L (ref 96–112)
GFR calc Af Amer: >90 mL/min (ref >90)
GFR calc non Af Amer: >90 mL/min (ref >90)
Glucose, Bld: 85 mg/dL (ref 70–99)
Potassium: 4.4 mEq/L (ref 3.7–5.3)
Sodium: 141 mEq/L (ref 137–147)

## 2013-06-11 LAB — CBC WITH DIFFERENTIAL/PLATELET
BASOS ABS: 0.1 10*3/uL (ref 0.0–0.1)
BASOS PCT: 1 % (ref 0–1)
Eosinophils Absolute: 0.7 10*3/uL (ref 0.0–0.7)
Eosinophils Relative: 9 % — ABNORMAL HIGH (ref 0–5)
HEMATOCRIT: 40.2 % (ref 36.0–46.0)
Hemoglobin: 13.7 g/dL (ref 12.0–15.0)
Lymphocytes Relative: 39 % (ref 12–46)
Lymphs Abs: 2.9 10*3/uL (ref 0.7–4.0)
MCH: 30.2 pg (ref 26.0–34.0)
MCHC: 34.1 g/dL (ref 30.0–36.0)
MCV: 88.7 fL (ref 78.0–100.0)
Monocytes Absolute: 0.5 10*3/uL (ref 0.1–1.0)
Monocytes Relative: 7 % (ref 3–12)
Neutro Abs: 3.4 10*3/uL (ref 1.7–7.7)
Neutrophils Relative %: 45 % (ref 43–77)
Platelets: 261 10*3/uL (ref 150–400)
RBC: 4.53 MIL/uL (ref 3.87–5.11)
RDW: 13.3 % (ref 11.5–15.5)
WBC: 7.6 10*3/uL (ref 4.0–10.5)

## 2013-06-11 LAB — POCT PREGNANCY, URINE
Preg Test, Ur: NEGATIVE
Preg Test, Ur: NEGATIVE

## 2013-06-11 LAB — POCT I-STAT TROPONIN I: TROPONIN I, POC: 0 ng/mL (ref 0.00–0.08)

## 2013-06-11 MED ORDER — FLUOXETINE HCL 20 MG PO CAPS
20.0000 mg | ORAL_CAPSULE | Freq: Every day | ORAL | Status: DC
  Administered 2013-06-12 – 2013-06-14 (×3): 20 mg via ORAL
  Filled 2013-06-11 (×3): qty 1

## 2013-06-11 MED ORDER — ENOXAPARIN SODIUM 40 MG/0.4ML ~~LOC~~ SOLN
40.0000 mg | SUBCUTANEOUS | Status: DC
  Administered 2013-06-12 – 2013-06-14 (×3): 40 mg via SUBCUTANEOUS
  Filled 2013-06-11 (×3): qty 0.4

## 2013-06-11 MED ORDER — ONDANSETRON HCL 4 MG PO TABS: 4.0000 mg | ORAL_TABLET | Freq: Four times a day (QID) | ORAL | Status: DC | PRN

## 2013-06-11 MED ORDER — SODIUM CHLORIDE 0.9 % IV BOLUS (SEPSIS)
1000.0000 mL | Freq: Once | INTRAVENOUS | Status: AC
  Administered 2013-06-11: 1000 mL via INTRAVENOUS

## 2013-06-11 MED ORDER — SODIUM CHLORIDE 0.9 % IJ SOLN
3.0000 mL | Freq: Two times a day (BID) | INTRAMUSCULAR | Status: DC
  Administered 2013-06-11 – 2013-06-14 (×4): 3 mL via INTRAVENOUS

## 2013-06-11 MED ORDER — ACETAMINOPHEN 650 MG RE SUPP: 650.0000 mg | Freq: Four times a day (QID) | RECTAL | Status: DC | PRN

## 2013-06-11 MED ORDER — MECLIZINE HCL 12.5 MG PO TABS
12.5000 mg | ORAL_TABLET | Freq: Three times a day (TID) | ORAL | Status: DC | PRN
  Filled 2013-06-11: qty 1

## 2013-06-11 MED ORDER — SODIUM CHLORIDE 0.9 % IV SOLN
INTRAVENOUS | Status: AC
  Administered 2013-06-11: 23:00:00 via INTRAVENOUS

## 2013-06-11 MED ORDER — ADULT MULTIVITAMIN W/MINERALS CH
1.0000 | ORAL_TABLET | Freq: Every day | ORAL | Status: DC
  Administered 2013-06-12 – 2013-06-14 (×3): 1 via ORAL
  Filled 2013-06-11 (×3): qty 1

## 2013-06-11 MED ORDER — ONDANSETRON HCL 4 MG/2ML IJ SOLN: 4.0000 mg | Freq: Four times a day (QID) | INTRAMUSCULAR | Status: DC | PRN

## 2013-06-11 MED ORDER — DIAZEPAM 5 MG PO TABS: 5.0000 mg | ORAL_TABLET | Freq: Two times a day (BID) | ORAL | Status: DC

## 2013-06-11 MED ORDER — ACETAMINOPHEN 325 MG PO TABS
650.0000 mg | ORAL_TABLET | Freq: Four times a day (QID) | ORAL | Status: DC | PRN
  Administered 2013-06-12: 650 mg via ORAL
  Filled 2013-06-11: qty 2

## 2013-06-11 MED ORDER — DIAZEPAM 5 MG/ML IJ SOLN
5.0000 mg | Freq: Once | INTRAMUSCULAR | Status: AC
  Administered 2013-06-11: 5 mg via INTRAVENOUS
  Filled 2013-06-11: qty 2

## 2013-06-11 MED ORDER — LORAZEPAM 0.5 MG PO TABS
0.2500 mg | ORAL_TABLET | Freq: Every day | ORAL | Status: DC
  Administered 2013-06-11 – 2013-06-13 (×3): 0.25 mg via ORAL
  Filled 2013-06-11 (×3): qty 1

## 2013-06-11 NOTE — ED Notes (Signed)
Pt in c/o near syncopal episode this am, states she has been dealing vertigo and multiple near syncopal episodes over the last 5 months and has been here for same, but states this am she woke up with a discharge from her left ear and her symptoms were increased, episode happened when patient was walking to her car to go to the doctor for her ear, pt c/o dizziness and feeling weak at this time

## 2013-06-11 NOTE — H&P (Signed)
Patient's PCP: [facility] (Dr. Dorothea Ogle?) Patient's neurologist: Dr. Frances Furbish Patient's cardiologist: Dr. Royann Shivers Patient's ENT: [facility]  Chief Complaint: Dizziness  History of Present Illness: Jane Munoz is a 43 y.o. African female with history of depression and anxiety who presents with the above complaints.  Patient reports that she has had dizziness for the last 3 months since starting of November.  She has had extensive workup in the past including neurology and cardiology evaluation which was negative.  She was also recently hospitalized from 06/04/[facility] 06/05/2013 for similar complaints.  Patient has also been recently evaluated by [facility].  She has seen physical therapy only once so far, due to ongoing dizziness with worsening symptoms at home, she presented to the emergency department for further evaluation.  She denies any recent fevers, recent episode of chest pain, shortness of breath, abdominal pain, headaches or vision changes.  Does complain of some nausea and vomiting.  She does note that her dizziness is worse with changing head positions, especially looking up at the ceiling or getting up.  If she keeps her head still her dizziness improves after period time.  She reports that she wakes up from sleep due to dizziness when she turns her head in sleep.  Due to her ongoing symptoms, hospitalist service was asked to admit the patient for further care and management.  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  Review of Systems: All systems reviewed with the patient and positive as per history of present illness, otherwise all other systems are negative.  Past Medical History  Diagnosis Date  . Depression   . Anxiety   . H/O varicella   . History of measles, mumps, or rubella   . Blood transfusion   . H/O blood clots    . H/O bladder infections   . Leaking of urine   . Preterm labor   . H/O dysmenorrhea 05/08/11  . Adenomyosis 05/08/11  . Pelvic pain 1/2/`13  . Dizziness    Past Surgical History  Procedure Laterality Date  . Hernia repair    . Wisdom tooth extraction    . Cesarean section       x 3   Family History  Problem Relation Age of Onset  . Cancer Mother   . Heart Problems Mother   . Diabetes Father   . Hypertension Father   . Asthma Sister    History   Social History  . Marital Status: Divorced    Spouse Name: N/A    Number of Children: 3  . Years of Education: Grad   Occupational History  . TEACHER    Social History Main Topics  . Smoking status: Never Smoker   . Smokeless tobacco: Never Used  . Alcohol Use: No  . Drug Use: No  . Sexual Activity: Not Currently   Other Topics Concern  . Not on file   Social History Narrative   Patient lives at home with family.   Caffeine Use: 1 cup daily   Allergies: Review of patient's allergies indicates no known allergies.  Home Meds: Prior to Admission medications   Medication Sig Start Date End Date Taking? Authorizing Provider  FLUoxetine (PROZAC) 20 MG capsule Take 20 mg by mouth daily.   Yes Historical Provider, MD  ibuprofen (ADVIL,MOTRIN) 200 MG tablet Take 800 mg by mouth every 6 (six)  hours as needed for mild pain.   Yes Historical Provider, MD  LORazepam (ATIVAN) 0.5 MG tablet Take 0.25 mg by mouth at bedtime.   Yes Historical Provider, MD  meclizine (ANTIVERT) 12.5 MG tablet Take 1 tablet (12.5 mg total) by mouth 3 (three) times daily as needed for dizziness. 05/20/13  Yes Ronal Fear, NP  Multiple Vitamin (MULTIVITAMIN WITH MINERALS) TABS Take 1 tablet by mouth daily.   Yes Historical Provider, MD  diazepam (VALIUM) 5 MG tablet Take 1 tablet (5 mg total) by mouth 2 (two) times daily. 06/11/13   Emilia Beck, PA-C    Physical Exam: Blood pressure 117/69, pulse 73, temperature 98.2 F (36.8 C), temperature source  Oral, resp. rate 16, last menstrual period 03/31/2013, SpO2 99.00%. General: Awake, Oriented x3, No acute distress. HEENT: EOMI, Moist mucous membranes, patient had difficulty following my fingers and had her eyes closed due to dizziness, did not appreciate any overt nystagmus. Neck: Supple CV: S1 and S2 Lungs: Clear to ascultation bilaterally Abdomen: Soft, Nontender, Nondistended, +bowel sounds. Ext: Good pulses. Trace edema. No clubbing or cyanosis noted. Neuro: Cranial Nerves II-XII grossly intact. Has 5/5 motor strength in upper and lower extremities.  Lab results:  Recent Labs  06/11/13 1401  NA 141  K 4.4  CL 102  CO2 26  GLUCOSE 85  BUN 11  CREATININE 0.49*  CALCIUM 9.2   No results found for this basename: AST, ALT, ALKPHOS, BILITOT, PROT, ALBUMIN,  in the last 72 hours No results found for this basename: LIPASE, AMYLASE,  in the last 72 hours  Recent Labs  06/11/13 1401  WBC 7.6  NEUTROABS 3.4  HGB 13.7  HCT 40.2  MCV 88.7  PLT 261   No results found for this basename: CKTOTAL, CKMB, CKMBINDEX, TROPONINI,  in the last 72 hours No components found with this basename: POCBNP,  No results found for this basename: DDIMER,  in the last 72 hours No results found for this basename: HGBA1C,  in the last 72 hours No results found for this basename: CHOL, HDL, LDLCALC, TRIG, CHOLHDL, LDLDIRECT,  in the last 72 hours No results found for this basename: TSH, T4TOTAL, FREET3, T3FREE, THYROIDAB,  in the last 72 hours No results found for this basename: VITAMINB12, FOLATE, FERRITIN, TIBC, IRON, RETICCTPCT,  in the last 72 hours Imaging results:  Dg Chest 2 View  06/04/2013   CLINICAL DATA:  Syncopal episode earlier today. Headache. Nausea. Cough.  EXAM: CHEST  2 VIEW  COMPARISON:  DG CHEST 2 VIEW dated 03/22/2013; CT ANGIO CHEST W/CM &/OR WO/CM dated 03/01/2013; DG CHEST 2 VIEW dated 03/01/2013  FINDINGS: Cardiomediastinal silhouette unremarkable and unchanged. Lungs clear.  Bronchovascular markings normal. Pulmonary vascularity normal. No visible pleural effusions. No pneumothorax. Mild degenerative changes involving the thoracic spine.  IMPRESSION: No acute cardiopulmonary disease.   Electronically Signed   By: Hulan Saas M.D.   On: 06/04/2013 17:39   Ct Head Wo Contrast  06/04/2013   CLINICAL DATA:  Dizziness for 3 months. Patient had dozing off episodes at the ENT office visit today. New onset cough and chest pressure.  EXAM: CT HEAD WITHOUT CONTRAST  TECHNIQUE: Contiguous axial images were obtained from the base of the skull through the vertex without intravenous contrast.  COMPARISON:  Brain MRI, 04/12/2013.  FINDINGS: Ventricles are normal in size and configuration. There are no parenchymal masses or mass effect. There are no areas of abnormal parenchymal attenuation there is no evidence of a cortical infarct.  There are no extra-axial masses or abnormal fluid collections.  There is no intracranial hemorrhage  Visualized sinuses, mastoid air cells and middle ear cavities are clear.  There is irregularity along the left petrous apex adjacent to the horizontal portion of the left internal carotid canal. This may be due to chronic fluid in the petrous apex air cells. However, given the symptoms and lack of other findings, this could reflect a more aggressive inflammatory process, and a follow-up high-resolution unenhanced temporal bone CT may be helpful.  IMPRESSION: No intracranial abnormality.  Opacification of left petrous apex air cells with the suggestion of bony irregularity. Consider a follow-up temporal bone CT as detailed above.   Electronically Signed   By: Amie Portlandavid  Ormond M.D.   On: 06/04/2013 17:51   Other results: EKG: Normal sinus rhythm, heart rate 60s.  Assessment & Plan by Problem: Dizziness/near syncope Etiology unclear, has had extensive workup in the past including neurology, ENT, and cardiology.  Uncertain if patient has BPPV?,  requested neurology  consultation.  Requested vestibular physical therapy evaluation in the morning.  Continue meclizine.  Serial neuro checks.  Continue to monitor on telemetry.  If after physical therapy and neurology evaluation, if patient has persistent symptoms he may consider further neurologic evaluation as outpatient.  Patient had question regarding if her symptoms could be caused by nano materials at school, uncertain, defer question to neurology.  Uncertain if patient's travel to [place] in summer of 2014 may be causing her symptoms from an infectious illness, low likelihood but should still be considered in the differential, again may consider outpatient workup with perhaps infectious diseases or neurology as outpatient.  Anxiety and depression Continue Prozac.  Prophylaxis Subcutaneous Lovenox.  CODE STATUS Full code.  Disposition Admit the patient to telemetry as observation.  Time spent on admission, talking to the patient, and coordinating care was: 50 mins.  Jagdeep Ancheta A, MD 06/11/2013, 9:52 PM

## 2013-06-11 NOTE — ED Provider Notes (Signed)
4:04 PM Patient signed out to me by Roxy Horsemanobert Browning, PA-C. Patient is dizzy and unable to walk at this time. We will try to alleviate symptoms here. If patient cannot walk, she will be admitted.   6:43 PM Patient has eaten and attempted to walk which was unsuccessful.   7:04 PM Patient is now saying she cannot support her body weight and is unable to walk. Her family is afraid of her going home. I will call for admission.   Emilia BeckKaitlyn Tahari Clabaugh, PA-C 06/11/13 1853  Emilia BeckKaitlyn Myesha Stillion, PA-C 06/11/13 2056

## 2013-06-11 NOTE — ED Notes (Signed)
Transporting patient to new room assignment. 

## 2013-06-11 NOTE — ED Notes (Signed)
Pt undressed, in gown, continuous bp cuff and pulse ox, warm blanket given, family at bedside.

## 2013-06-11 NOTE — ED Provider Notes (Signed)
EKG Interpretation    Date/Time:  Friday June 11 2013 12:41:07 EST Ventricular Rate:  68 PR Interval:  178 QRS Duration: 78 QT Interval:  378 QTC Calculation: 401 R Axis:   74 Text Interpretation:  Normal sinus rhythm Normal ECG No significant change since last tracing Confirmed by WARD  DO, KRISTEN ([id number]) on 06/11/2013 2:38:31 PM             Layla MawKristen N Ward, DO 06/11/13 1546

## 2013-06-11 NOTE — Discharge Instructions (Signed)
Take Valium as needed for dizziness. Follow up with your doctor for further evaluation. Return to the ED with worsening or concerning symptoms.

## 2013-06-11 NOTE — ED Notes (Signed)
PA at bedside discussing discharge with pt and family.  Family not agreeing with discharge.  PA to see if pt can be admitted.

## 2013-06-11 NOTE — ED Provider Notes (Signed)
CSN: [id number]     Arrival date & time 06/11/13  1235 History   First MD Initiated Contact with Patient 06/11/13 1245     Chief Complaint  Patient presents with  . Near Syncope   (Consider location/radiation/quality/duration/timing/severity/associated sxs/prior Treatment) HPI Comments: Patient presents to the emergency department with chief complaint of dizziness and syncopal episode. She states that she does syncopal episode this morning while at school. She states that she became dizzy and then passed out. She denies seeing her head. Denies being in any pain. She states that she is no longer dizzy, because she took some meclizine. She has had several of these episodes in the past, and was recently admitted and discharged on January 31. She was scheduled to see ENT today, but did not make it because of the syncopal episode. She states when she woke this morning she noticed some discharge from her left ear. She denies any fevers, chills, nausea, vomiting, diarrhea, or constipation. She denies any dizziness or focal weakness or numbness at this time. She states that she does feel fatigued.  The history is provided by the patient. No language interpreter was used.    Past Medical History  Diagnosis Date  . Depression   . Anxiety   . H/O varicella   . History of measles, mumps, or rubella   . Blood transfusion   . H/O blood clots   . H/O bladder infections   . Leaking of urine   . Preterm labor   . H/O dysmenorrhea 05/08/11  . Adenomyosis 05/08/11  . Pelvic pain 1/2/`13  . Dizziness    Past Surgical History  Procedure Laterality Date  . Hernia repair    . Wisdom tooth extraction    . Cesarean section       x 3   Family History  Problem Relation Age of Onset  . Cancer Mother   . Heart Problems Mother   . Diabetes Father   . Hypertension Father   . Asthma Sister    History  Substance Use Topics  . Smoking status: Never Smoker   . Smokeless tobacco: Never Used  . Alcohol Use: No    OB History   Grav Para Term Preterm Abortions TAB SAB Ect Mult Living   4 3 2 1 1  1   3      Review of Systems  All other systems reviewed and are negative.    Allergies  Review of patient's allergies indicates no known allergies.  Home Medications   Current Outpatient Rx  Name  Route  Sig  Dispense  Refill  . FLUoxetine (PROZAC) 20 MG capsule   Oral   Take 20 mg by mouth daily.         Marland Kitchen ibuprofen (ADVIL,MOTRIN) 200 MG tablet   Oral   Take 800 mg by mouth every 6 (six) hours as needed for mild pain.         Marland Kitchen LORazepam (ATIVAN) 0.5 MG tablet   Oral   Take 0.25 mg by mouth at bedtime.         . meclizine (ANTIVERT) 12.5 MG tablet   Oral   Take 1 tablet (12.5 mg total) by mouth 3 (three) times daily as needed for dizziness.   90 tablet   5   . Multiple Vitamin (MULTIVITAMIN WITH MINERALS) TABS   Oral   Take 1 tablet by mouth daily.          BP 126/76  Pulse 57  Temp(Src)  98.7 F (37.1 C) (Oral)  Resp 16  SpO2 95%  LMP 03/31/2013 Physical Exam  Nursing note and vitals reviewed. Constitutional: She is oriented to person, place, and time. She appears well-developed and well-nourished.  HENT:  Head: Normocephalic and atraumatic.  Bilateral tympanic membranes are clear, small fluid level behind the right tympanic membrane, left tympanic membrane has some mild congestion behind it, no ear edema, abscess, or gross infection  Eyes: Conjunctivae and EOM are normal. Pupils are equal, round, and reactive to light.  Neck: Normal range of motion. Neck supple.  Cardiovascular: Normal rate and regular rhythm.  Exam reveals no gallop and no friction rub.   No murmur heard. Pulmonary/Chest: Effort normal and breath sounds normal. No respiratory distress. She has no wheezes. She has no rales. She exhibits no tenderness.  Abdominal: Soft. Bowel sounds are normal. She exhibits no distension and no mass. There is no tenderness. There is no rebound and no guarding.   Musculoskeletal: Normal range of motion. She exhibits no edema and no tenderness.  Neurological: She is alert and oriented to person, place, and time.  Skin: Skin is warm and dry.  Psychiatric: She has a normal mood and affect. Her behavior is normal. Judgment and thought content normal.    ED Course  Procedures (including critical care time) Labs Review Labs Reviewed  CBC WITH DIFFERENTIAL  BASIC METABOLIC PANEL   Imaging Review No results found.  EKG Interpretation    Date/Time:  Friday June 11 2013 12:41:07 EST Ventricular Rate:  68 PR Interval:  178 QRS Duration: 78 QT Interval:  378 QTC Calculation: 401 R Axis:   74 Text Interpretation:  Normal sinus rhythm Normal ECG No significant change since last tracing Confirmed by WARD  DO, KRISTEN ([id number]) on 06/11/2013 2:38:31 PM            MDM  No diagnosis found.  Patient was syncopal episode. She has had this in the past. She denies any chest pain or shortness of breath. She was recently admitted for cardiac rule out. She has had extensive workup regarding her dizziness, including consultation by cardiology, neurology, and ENT. She has had CT scan, MRI, 30 day loop recorder, and still no clear etiology of the patient's dizziness. Upon the recent hospitalist admission, in the discharge summary, as stated that there is nothing else left to offer for the dizziness. Do not think that the syncope was cardiac in nature. The patient became dizzy prior to passing out. There is no seizure-like activity. She is not postictal.  Low risk for serious outcome per [facility] Syncope Rule.  No hx of CHF, hematocrit >30%, no new EKG changes or arrhythmias, no SOB, systolic BP >90 in triage.  2:41 PM Patient discussed with Dr. Elesa MassedWard, who recommends symptom control and urine pregnancy test.  If able to control symptoms, then patient can go home.  If not, consider admission.  Will give IV valium.  3:45 PM Patient signed out to on-coming  PA Szekalski.  Plan: Follow-up on labs.  Symptom control for vertigo.     Roxy Horsemanobert [employer] [place], PA-C 06/11/13 1546

## 2013-06-11 NOTE — Consult Note (Signed)
Neurology Consultation Reason for Consult: Generalized weakness, dizziness Referring Physician: Ephriam Jenkins  CC: Generalized weakness  History is obtained from: Patient  HPI: Jane Munoz is a 43 y.o. female with a history of dizziness and generalized weakness that has been ongoing since November. She has days where she feels well, and days where she has persistent dizziness. She misses one to 2 days of school per week due to these symptoms. She states that when she is weak she just feels generally weak all over.   She describes "passing out today,"  though when pressed she states that she did not lose consciousness, but just felt so weak all over that she could not support herself.  She states that the dizziness is made significantly worse by looking up. She has been evaluated by Dr. Rexene Alberts with neurology as well as  ROS: A 14 point ROS was performed and is negative except as noted in the HPI.  Past Medical History  Diagnosis Date  . Depression   . Anxiety   . H/O varicella   . History of measles, mumps, or rubella   . Blood transfusion   . H/O blood clots   . H/O bladder infections   . Leaking of urine   . Preterm labor   . H/O dysmenorrhea 05/08/11  . Adenomyosis 05/08/11  . Pelvic pain 1/2/`13  . Dizziness     Family History: No history of similar  Social History: Tob: denies  Exam: Current vital signs: BP 108/73  Pulse 68  Temp(Src) 98 F (36.7 C) (Oral)  Resp 16  Ht 5' (1.524 m)  Wt 85 kg (187 lb 6.3 oz)  BMI 36.60 kg/m2  SpO2 97%  LMP 03/31/2013 Vital signs in last 24 hours: Temp:  [98 F (36.7 C)-98.7 F (37.1 C)] 98 F (36.7 C) (02/06 2208) Pulse Rate:  [57-86] 68 (02/06 2208) Resp:  [16-20] 16 (02/06 1837) BP: (102-135)/(58-87) 108/73 mmHg (02/06 2208) SpO2:  [95 %-100 %] 97 % (02/06 2208) Weight:  [85 kg (187 lb 6.3 oz)] 85 kg (187 lb 6.3 oz) (02/06 2208)  General: in bed, NAD CV: RRR Mental Status: Patient is awake, but repeatedly closes eyes.  When eyes are opened, there is a bell's phenomenon. oriented to person, place, month, year, and situation. Immediate and remote memory are intact. Patient is able to give a clear and coherent history. No signs of aphasia or neglect Cranial Nerves: II: Visual Fields are full. Pupils are equal, round, and reactive to light.  Discs are difficult to visualize. III,IV, VI: EOMI without ptosis or diploplia.  V: Facial sensation is symmetric to temperature VII: Facial movement is symmetric.  VIII: hearing is intact to voice X: Uvula elevates symmetrically XI: Shoulder shrug is symmetric. XII: tongue is midline without atrophy or fasciculations.  Motor: Tone is normal. Bulk is normal. At least 4/5 strength was present in all four extremities, she had give-way weakness and would hold an arm up without drift, then suddenly let it collapse.  Sensory: Sensation is symmetric to light touch and temperature in the arms and legs. Deep Tendon Reflexes: 2+ and symmetric in the biceps and patellae.  Cerebellar: FNF are intact but slow bilaterally Gait: Not tested due to patient remaining with eyes closed.    dix-hallpike negative.      I have reviewed labs in epic and the results pertinent to this consultation are: BMP, CBC unremarkable.  Previous workup: Cortisol normal. TSH normal   I have reviewed the images obtained:MRI  brain and C-spine - no cause of symptoms.   Impression: 43 yo F with chronic asthenia and dizziness. Her exam today has some aspects concerning for at least psychogenic overlay. If her asthenia were to be worked, could consider autoimmune workup, or referral to a rheumatologist. I do not see any reason to suspect a neurological cause of her weakness at this time. If her symptoms were vascular in nature, would have expected findings on her MRI.   Her dizziness could be BPPV, though dix-hallpike is negative. I suspect there may be a component of conversion disorder, though  this would be difficult to prove conclusively.   Recommendations: 1) Agree with outpatient PT for vertigo, asthenia.  2) Could check ESR, if markedly elevated pursue further immune workup.  3) Could consider psychological referral as I suspect at least some psychological overlay to her presentation.  4) f/u with ENT/outpatient neurology as scheduled. Please call with any further questions.     Roland Rack, MD [facility] [phone]  If 7pm- 7am, please page neurology on call at [phone].

## 2013-06-12 ENCOUNTER — Encounter (HOSPITAL_COMMUNITY): Payer: Self-pay | Admitting: Psychiatry

## 2013-06-12 DIAGNOSIS — F3289 Other specified depressive episodes: Secondary | ICD-10-CM

## 2013-06-12 DIAGNOSIS — F411 Generalized anxiety disorder: Secondary | ICD-10-CM

## 2013-06-12 DIAGNOSIS — F329 Major depressive disorder, single episode, unspecified: Secondary | ICD-10-CM

## 2013-06-12 LAB — BASIC METABOLIC PANEL
BUN: 13 mg/dL (ref 6–23)
CALCIUM: 8.3 mg/dL — AB (ref 8.4–10.5)
CHLORIDE: 105 meq/L (ref 96–112)
CO2: 23 meq/L (ref 19–32)
Creatinine, Ser: 0.55 mg/dL (ref 0.50–1.10)
GFR calc non Af Amer: >90 mL/min (ref >90)
Glucose, Bld: 78 mg/dL (ref 70–99)
Potassium: 4.4 mEq/L (ref 3.7–5.3)
Sodium: 140 mEq/L (ref 137–147)

## 2013-06-12 LAB — CBC
HEMATOCRIT: 37.5 % (ref 36.0–46.0)
Hemoglobin: 12.6 g/dL (ref 12.0–15.0)
MCH: 30.1 pg (ref 26.0–34.0)
MCHC: 33.6 g/dL (ref 30.0–36.0)
MCV: 89.5 fL (ref 78.0–100.0)
Platelets: 235 10*3/uL (ref 150–400)
RBC: 4.19 MIL/uL (ref 3.87–5.11)
RDW: 13.5 % (ref 11.5–15.5)
WBC: 6.9 10*3/uL (ref 4.0–10.5)

## 2013-06-12 LAB — SEDIMENTATION RATE: Sed Rate: 27 mm/hr — ABNORMAL HIGH (ref 0–22)

## 2013-06-12 NOTE — Consult Note (Signed)
[facility] Face-to-Face Psychiatry Consult   Reason for Consult:  Dizziness Referring Physician:  Md Curry Dulski is an 43 y.o. female. Total Time spent with patient: 30 minutes  Assessment: AXIS I:  Anxiety Disorder NOS and Depressive Disorder NOS AXIS II:  Deferred AXIS III:   Past Medical History  Diagnosis Date  . Depression   . Anxiety   . H/O varicella   . History of measles, mumps, or rubella   . Blood transfusion   . H/O blood clots   . H/O bladder infections   . Leaking of urine   . Preterm labor   . H/O dysmenorrhea 05/08/11  . Adenomyosis 05/08/11  . Pelvic pain 1/2/`13  . Dizziness    AXIS IV:  other psychosocial or environmental problems, problems related to social environment and problems with primary support group AXIS V:  70; mild symptoms  Plan:  No evidence of imminent risk to self or others at present.  Continue with medical evaluation as organic reasons appear to be the main reason for her dizziness.  Dr. Dwyane Dee, psychiatrist, reviewed the patient and concur with the findings.  Subjective:   Garnell Corum is a 43 y.o. female patient does not warrant admission.  HPI:  Patient experiencing dizziness along with other people from her work place.  She denies depression, suicidal/homicidal ideations, depression, anxiety,  and hallucinations.  A family member was present on assessment--the patient did not want the patient.   HPI Elements:   Location:  generalized. Quality:  acute. Severity:  severe. Timing:  constant. Duration:  since November. Context:  stressors.  Past Psychiatric History: Past Medical History  Diagnosis Date  . Depression   . Anxiety   . H/O varicella   . History of measles, mumps, or rubella   . Blood transfusion   . H/O blood clots   . H/O bladder infections   . Leaking of urine   . Preterm labor   . H/O dysmenorrhea 05/08/11  . Adenomyosis 05/08/11  . Pelvic pain 1/2/`13  . Dizziness     reports that she has never smoked. She has  never used smokeless tobacco. She reports that she does not drink alcohol or use illicit drugs. Family History  Problem Relation Age of Onset  . Cancer Mother   . Heart Problems Mother   . Diabetes Father   . Hypertension Father   . Asthma Sister      Living Arrangements: Children;Other relatives   Abuse/Neglect [facility]) Physical Abuse: Denies Verbal Abuse: Denies Sexual Abuse: Denies Allergies:  No Known Allergies  ACT Assessment Complete:  No:   Past Psychiatric History: Diagnosis:  Depression, anxiety  Hospitalizations:  None  Outpatient Care:  None  Substance Abuse Care:  NOne  Self-Mutilation:  None  Suicidal Attempts:  None  Homicidal Behaviors:  None   Violent Behaviors:  None   Objective: Blood pressure 115/77, pulse 87, temperature 98.6 F (37 C), temperature source Oral, resp. rate 17, height 5' (1.524 m), weight 187 lb 6.3 oz (85 kg), last menstrual period 03/31/2013, SpO2 97.00%.Body mass index is 36.6 kg/(m^2). Results for orders placed during the hospital encounter of 06/11/13 (from the past 72 hour(s))  CBC WITH DIFFERENTIAL     Status: Abnormal   Collection Time    06/11/13  2:01 PM      Result Value Range   WBC 7.6  4.0 - 10.5 K/uL   RBC 4.53  3.87 - 5.11 MIL/uL   Hemoglobin 13.7  12.0 - 15.0  g/dL   HCT 40.2  36.0 - 46.0 %   MCV 88.7  78.0 - 100.0 fL   MCH 30.2  26.0 - 34.0 pg   MCHC 34.1  30.0 - 36.0 g/dL   RDW 13.3  11.5 - 15.5 %   Platelets 261  150 - 400 K/uL   Neutrophils Relative % 45  43 - 77 %   Neutro Abs 3.4  1.7 - 7.7 K/uL   Lymphocytes Relative 39  12 - 46 %   Lymphs Abs 2.9  0.7 - 4.0 K/uL   Monocytes Relative 7  3 - 12 %   Monocytes Absolute 0.5  0.1 - 1.0 K/uL   Eosinophils Relative 9 (*) 0 - 5 %   Eosinophils Absolute 0.7  0.0 - 0.7 K/uL   Basophils Relative 1  0 - 1 %   Basophils Absolute 0.1  0.0 - 0.1 K/uL  BASIC METABOLIC PANEL     Status: Abnormal   Collection Time    06/11/13  2:01 PM      Result Value Range   Sodium 141   137 - 147 mEq/L   Potassium 4.4  3.7 - 5.3 mEq/L   Chloride 102  96 - 112 mEq/L   CO2 26  19 - 32 mEq/L   Glucose, Bld 85  70 - 99 mg/dL   BUN 11  6 - 23 mg/dL   Creatinine, Ser 0.49 (*) 0.50 - 1.10 mg/dL   Calcium 9.2  8.4 - 10.5 mg/dL   GFR calc non Af Amer >90  >90 mL/min   GFR calc Af Amer >90  >90 mL/min   Comment: (NOTE)     The eGFR has been calculated using the CKD EPI equation.     This calculation has not been validated in all clinical situations.     eGFR's persistently <90 mL/min signify possible Chronic Kidney     Disease.  POCT I-STAT TROPONIN I     Status: None   Collection Time    06/11/13  3:27 PM      Result Value Range   Troponin i, poc 0.00  0.00 - 0.08 ng/mL   Comment 3            Comment: Due to the release kinetics of cTnI,     a negative result within the first hours     of the onset of symptoms does not rule out     myocardial infarction with certainty.     If myocardial infarction is still suspected,     repeat the test at appropriate intervals.  POCT PREGNANCY, URINE     Status: None   Collection Time    06/11/13  3:53 PM      Result Value Range   Preg Test, Ur NEGATIVE  NEGATIVE   Comment:            THE SENSITIVITY OF THIS     METHODOLOGY IS >24 mIU/mL  POCT PREGNANCY, URINE     Status: None   Collection Time    06/11/13  3:55 PM      Result Value Range   Preg Test, Ur NEGATIVE  NEGATIVE   Comment:            THE SENSITIVITY OF THIS     METHODOLOGY IS >24 mIU/mL  BASIC METABOLIC PANEL     Status: Abnormal   Collection Time    06/12/13  4:55 AM      Result Value  Range   Sodium 140  137 - 147 mEq/L   Potassium 4.4  3.7 - 5.3 mEq/L   Chloride 105  96 - 112 mEq/L   CO2 23  19 - 32 mEq/L   Glucose, Bld 78  70 - 99 mg/dL   BUN 13  6 - 23 mg/dL   Creatinine, Ser 0.55  0.50 - 1.10 mg/dL   Calcium 8.3 (*) 8.4 - 10.5 mg/dL   GFR calc non Af Amer >90  >90 mL/min   GFR calc Af Amer >90  >90 mL/min   Comment: (NOTE)     The eGFR has been  calculated using the CKD EPI equation.     This calculation has not been validated in all clinical situations.     eGFR's persistently <90 mL/min signify possible Chronic Kidney     Disease.  CBC     Status: None   Collection Time    06/12/13  4:55 AM      Result Value Range   WBC 6.9  4.0 - 10.5 K/uL   RBC 4.19  3.87 - 5.11 MIL/uL   Hemoglobin 12.6  12.0 - 15.0 g/dL   HCT 37.5  36.0 - 46.0 %   MCV 89.5  78.0 - 100.0 fL   MCH 30.1  26.0 - 34.0 pg   MCHC 33.6  30.0 - 36.0 g/dL   RDW 13.5  11.5 - 15.5 %   Platelets 235  150 - 400 K/uL  SEDIMENTATION RATE     Status: Abnormal   Collection Time    06/12/13  4:55 AM      Result Value Range   Sed Rate 27 (*) 0 - 22 mm/hr   Labs are reviewed and are pertinent for any medical issues..  Current Facility-Administered Medications  Medication Dose Route Frequency Provider Last Rate Last Dose  . 0.9 %  sodium chloride infusion   Intravenous Continuous Bynum Bellows, MD 75 mL/hr at 06/11/13 2310    . acetaminophen (TYLENOL) tablet 650 mg  650 mg Oral Q6H PRN Bynum Bellows, MD   650 mg at 06/12/13 1051   Or  . acetaminophen (TYLENOL) suppository 650 mg  650 mg Rectal Q6H PRN Bynum Bellows, MD      . enoxaparin (LOVENOX) injection 40 mg  40 mg Subcutaneous Q24H Bynum Bellows, MD   40 mg at 06/12/13 1041  . FLUoxetine (PROZAC) capsule 20 mg  20 mg Oral Daily Bynum Bellows, MD   20 mg at 06/12/13 1041  . LORazepam (ATIVAN) tablet 0.25 mg  0.25 mg Oral QHS Bynum Bellows, MD   0.25 mg at 06/11/13 2310  . meclizine (ANTIVERT) tablet 12.5 mg  12.5 mg Oral TID PRN Bynum Bellows, MD      . multivitamin with minerals tablet 1 tablet  1 tablet Oral Daily Bynum Bellows, MD   1 tablet at 06/12/13 1041  . ondansetron (ZOFRAN) tablet 4 mg  4 mg Oral Q6H PRN Bynum Bellows, MD       Or  . ondansetron (ZOFRAN) injection 4 mg  4 mg Intravenous Q6H PRN Bynum Bellows, MD      . sodium chloride 0.9 % injection 3 mL  3 mL Intravenous Q12H Bynum Bellows,  MD   3 mL at 06/11/13 2310    Psychiatric Specialty Exam:     Blood pressure 115/77, pulse 87, temperature 98.6 F (37 C), temperature source Oral, resp. rate 17,  height 5' (1.524 m), weight 187 lb 6.3 oz (85 kg), last menstrual period 03/31/2013, SpO2 97.00%.Body mass index is 36.6 kg/(m^2).  General Appearance: Casual  Eye Contact::  Fair  Speech:  Slow  Volume:  Decreased  Mood:  Depressed  Affect:  Congruent  Thought Process:  Coherent  Orientation:  Full (Time, Place, and Person)  Thought Content:  WDL  Suicidal Thoughts:  No  Homicidal Thoughts:  No  Memory:  Immediate;   Good Recent;   Good Remote;   Good  Judgement:  Good  Insight:  Fair  Psychomotor Activity:  Decreased  Concentration:  Fair  Recall:  Good  Fund of Knowledge:Good  Language: Good  Akathisia:  No  Handed:  Right  AIMS (if indicated):     Assets:  Resilience Social Support  Sleep:      Musculoskeletal: Strength & Muscle Tone: not able to assess due to patient's dizziness Gait & Station: unable to assess due patient's dizziness Patient leans: N/A  Treatment Plan Summary: Daily contact with patient to assess and evaluate symptoms and progress in treatment Medication management  Waylan Boga, PMH-NP 06/12/2013 9:10 PM

## 2013-06-12 NOTE — ED Provider Notes (Signed)
Medical screening examination/treatment/procedure(s) were conducted as a shared visit with non-physician practitioner(s) and myself.  I personally evaluated the patient during the encounter.   Pt with acute worsening of chronic vertigo.  Unable to ambulate after treatment in ER.  Admit for inpt treatment.  Darlys Galesavid Masneri, MD 06/12/13 [phone]

## 2013-06-12 NOTE — Evaluation (Signed)
Physical Therapy Evaluation Patient Details Name: Jane Munoz MRN: [id number] DOB: 1970/09/01 Today's Date: 06/12/2013 Time: [id number] PT Time Calculation (min): 56 min  PT Assessment / Plan / Recommendation History of Present Illness  Jane Munoz is a 43 y.o. African female with history of depression and anxiety who presents with the above complaints.  Patient reports that she has had dizziness for the last 3 months since starting of November.  She has had extensive workup in the past including neurology and cardiology evaluation which was negative.  She was also recently hospitalized from 06/04/2013 [facility] 06/05/2013 for similar complaints.  Patient has also been [facility].  She has seen physical therapy only once so far, due to ongoing dizziness with worsening symptoms at home, she presented to the emergency department for further evaluation.  She denies any recent fevers, recent episode of chest pain, shortness of breath, abdominal pain, headaches or vision changes.  Does complain of some nausea and vomiting.  She does note that her dizziness is worse with changing head positions, especially looking up at the ceiling or getting up.  If she keeps her head still her dizziness improves after period time.  She reports that she wakes up from sleep due to dizziness when she turns her head in sleep.  Due to her ongoing symptoms, hospitalist service was asked to admit the patient for further care and management.  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  Clinical Impression  Asked to see pt for Assessment for BPPV.  Answers to my subjective questions suggested atypical vertigo with reports of dizziness with on/off spinning that often lasted minutes more than seconds, caused pt to experience intermittent ringing and "heat" coming from R ear at  times.  Pt reports lately s/s have not been so much dizziness as feeling faint and that is what this therapist experienced.  I decided to work on confirming or negating BPPV.  Started with L  Side due to pt's reports suggested R sided dysfunction.   Hallpike-Dix to L produced no visible nystagmus, but did ellicit strabismus then both eyes "rolling up into her head" and pt becoming relatively unresponsive (not responding verbally, but pt reports she hears what's going on).  This subsided in >1071min.  Decided to continue with Epply eventhough did not fully understand the pt's response.  After sitting EOB post Epply for L, pt again had a "?presyncopal event" with eyes rolling up into head then vomited x3.  Pt unable to tolerate testing on the R as of this session.  Will try 06/13/13.  Of note, pt reports discharge out of L ear this admission, she reports no migraine and does have stiff neck on R in area of traps.    PT Assessment  Patient needs continued PT services    Follow Up Recommendations  Outpatient PT    Does the patient have the potential to tolerate intense rehabilitation      Barriers to Discharge        Equipment Recommendations       Recommendations for Other Services     Frequency Min 2X/week    Precautions / Restrictions Precautions Precautions: Fall   Pertinent Vitals/Pain       Mobility  Bed Mobility Overal bed mobility: Needs Assistance Bed Mobility: Supine to Sit Supine to sit: Min assist Transfers Overall transfer level: Needs  assistance Transfers: Sit to/from [employer] Sit to Stand: Min assist Stand pivot transfers: Min assist General transfer comment: assist needed when vertigo present after testing    Exercises     PT Diagnosis: Other (comment) (vertigo)  PT Problem List: Decreased balance;Other (comment) (vertigo) PT Treatment Interventions: Other (comment) (vestibular assessment)     PT Goals(Current goals can be found in the care  plan section) Acute Rehab PT Goals Patient Stated Goal: try to figur out what's going on PT Goal Formulation: With patient Time For Goal Achievement: 06/16/13 Potential to Achieve Goals: Fair  Visit Information  Last PT Received On: 06/12/13 Assistance Needed: +1 History of Present Illness: Jane Munoz is a 43 y.o. African female with history of depression and anxiety who presents with the above complaints.  Patient reports that she has had dizziness for the last 3 months since starting of November.  She has had extensive workup in the past including neurology and cardiology evaluation which was negative.  She was also recently hospitalized from [facility]/04/[facility] 06/05/2013 for similar complaints.  Patient has also been recently evaluated by [facility].  She has seen physical therapy only once so far, due to ongoing dizziness with worsening symptoms at home, she presented to the emergency department for further evaluation.  She denies any recent fevers, recent episode of chest pain, shortness of breath, abdominal pain, headaches or vision changes.  Does complain of some nausea and vomiting.  She does note that her dizziness is worse with changing head positions, especially looking up at the ceiling or getting up.  If she keeps her head still her dizziness improves after period time.  She reports that she wakes up from sleep due to dizziness when she turns her head in sleep.  Due to her ongoing symptoms, hospitalist service was asked to admit the patient for further care and management.  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       Prior Functioning  Home Living Family/patient expects to be discharged to:: Private residence Living Arrangements: Children;Other relatives Additional Comments: pt lives at home with 2 kids 14, and 10. Daughter is special needs.  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 Pt lives in ground floor apartment Prior Function Level of Independence: Independent Communication Communication: No difficulties Dominant Hand: Right    Cognition  Cognition Behavior During Therapy: Flat affect;WFL for tasks assessed/performed Overall Cognitive Status: Within Functional Limits for tasks assessed    Extremity/Trunk Assessment Upper Extremity Assessment Upper Extremity Assessment: Overall WFL for tasks assessed Lower Extremity Assessment Lower Extremity Assessment: Overall WFL for tasks assessed   Balance Balance Overall balance assessment: Needs assistance Sitting balance-Leahy Scale: Fair Standing balance support: Bilateral upper extremity supported Standing balance-Leahy Scale: Fair  End of Session PT - End of Session Activity Tolerance: Patient tolerated treatment well;Other (comment) (full assessment limited by nausea and ?presyncope) Patient left: in bed;with call bell/phone within reach;with family/visitor present Nurse Communication: Mobility status  GP Functional Assessment Tool Used: clinical judgement Functional Limitation: Other PT subsequent (vestibular issues affecting function) Other PT Secondary Current Status (Z6109): At least 1 percent but less than 20 percent impaired, limited or restricted Other PT Secondary Goal Status (U0454): At least 1 percent but less than 20 percent impaired, limited or restricted   Jarvis Sawa,  Eliseo Gum 06/12/2013, 2:06 PM  06/12/2013  [place] Bing, PT [phone]  (pager)

## 2013-06-12 NOTE — Progress Notes (Signed)
Utilization Review completed.  

## 2013-06-12 NOTE — Progress Notes (Signed)
[facility] PROGRESS NOTE  Jane Munoz ZOX:[id number] DOB: 1970/12/24 DOA: 06/11/2013 PCP: Pcp Not In System  Assessment/Plan: 1. Vertigo/dizziness -  Physical therapy on board and making further recommendations - Neurology evaluated earlier this admission and recommended outpatient PT for vertigo, asthenia.  - Consult psychiatry for further evaluation or recommendations (to exclude psychiatric cause as contributing symptom for primary problem).  2.  Anxiety and Depression - Pt on fluoxetine and lorazepam  DVT prophylaxis - Lovenox  Code Status: full Family Communication: No family present Disposition Plan: Pending further evaluation and recommendations by PT and psychiatry   Consultants:  PT  Neurology  Procedures:  none  Antibiotics:  None  HPI/Subjective: Pt has no new complaints. No acute issues overnight.  Had Epley maneuvers done and patient reports feeling more dizzy after Epley maneuvers.    Objective: Filed Vitals:   06/12/13 0446  BP: 104/70  Pulse: 72  Temp: 98.3 F (36.8 C)  Resp: 18    Intake/Output Summary (Last 24 hours) at 06/12/13 1455 Last data filed at 06/12/13 [id number]  Gross per 24 hour  Intake    240 ml  Output    300 ml  Net    -60 ml   Filed Weights   06/11/13 2208 06/12/13 0500  Weight: 85 kg (187 lb 6.3 oz) 85 kg (187 lb 6.3 oz)    Exam:   General:  Pt in NAD, Alert and awake  Cardiovascular: RRR, no MRG  Respiratory: CTA BL, no wheezes  Abdomen: soft, ND, obese  Musculoskeletal: no cyanosis or clubbing   Data Reviewed: Basic Metabolic Panel:  Recent Labs Lab 06/11/13 1401 06/12/13 0455  NA 141 140  K 4.4 4.4  CL 102 105  CO2 26 23  GLUCOSE 85 78  BUN 11 13  CREATININE 0.49* 0.55  CALCIUM 9.2 8.3*   Liver Function Tests: No results found for this basename: AST, ALT, ALKPHOS, BILITOT, PROT, ALBUMIN,  in the last 168 hours No results found for this basename: LIPASE, AMYLASE,  in the last 168  hours No results found for this basename: AMMONIA,  in the last 168 hours CBC:  Recent Labs Lab 06/11/13 1401 06/12/13 0455  WBC 7.6 6.9  NEUTROABS 3.4  --   HGB 13.7 12.6  HCT 40.2 37.5  MCV 88.7 89.5  PLT 261 235   Cardiac Enzymes: No results found for this basename: CKTOTAL, CKMB, CKMBINDEX, TROPONINI,  in the last 168 hours BNP (last 3 results) No results found for this basename: PROBNP,  in the last 8760 hours CBG: No results found for this basename: GLUCAP,  in the last 168 hours  No results found for this or any previous visit (from the past 240 hour(s)).   Studies: No results found.  Scheduled Meds: . enoxaparin (LOVENOX) injection  40 mg Subcutaneous Q24H  . FLUoxetine  20 mg Oral Daily  . LORazepam  0.25 mg Oral QHS  . multivitamin with minerals  1 tablet Oral Daily  . sodium chloride  3 mL Intravenous Q12H   Continuous Infusions: . sodium chloride 75 mL/hr at 06/11/13 2310    Principal Problem:   Vertigo Active Problems:   Anxiety and depression   Near syncope   Dizziness    Time spent: > 35minutes    Penny PiaVEGA, Jane Pietro  [facility] Pager [phone]. If 7PM-7AM, please contact night-coverage at [email or web address], password [email or web address] 06/12/2013, 2:55 PM  LOS: 1 day

## 2013-06-13 ENCOUNTER — Encounter (HOSPITAL_COMMUNITY): Payer: Self-pay | Admitting: Radiology

## 2013-06-13 ENCOUNTER — Observation Stay (HOSPITAL_COMMUNITY): Payer: BC Managed Care – PPO

## 2013-06-13 NOTE — Progress Notes (Signed)
Pt called to station for assist, nurse went to room to assist and Pt was already up in room and ambulated to the bathroom by herself, when asked why she didn't wait for assistance, she said she felt she was Ok to ambulate to the  Bathroom. Pt was assisted and ensured Pt' was safely back in bed. Call light placed within reach.

## 2013-06-13 NOTE — Progress Notes (Signed)
[facility] PROGRESS NOTE  Jane Munoz [id number] DOB: 15-Nov-1970 DOA: 06/11/2013 PCP: Pcp Not In System  Assessment/Plan: 1. Vertigo/dizziness -  Physical therapy on board and making further recommendations - Neurology evaluated earlier this admission and recommended outpatient PT for vertigo, asthenia.  - Consult psychiatry for further evaluation or recommendations (to exclude psychiatric cause as contributing symptom for primary problem). - CT Temporal Bones: no specific findings that would suggest significant inflammation.  2.  Anxiety and Depression - Pt on fluoxetine and lorazepam  DVT prophylaxis - Lovenox  Code Status: full Family Communication: No family present Disposition Plan: Pending further evaluation and recommendations by PT and psychiatry   Consultants:  PT  Neurology  Psychiatry  Procedures:  CT temporal bones  Antibiotics:  None  HPI/Subjective: Pt has no new complaints. No acute issues overnight.  Pt reports improvement in dizziness today.    Objective: Filed Vitals:   06/13/13 0621  BP: 110/74  Pulse: 67  Temp: 98 F (36.7 C)  Resp: 16    Intake/Output Summary (Last 24 hours) at 06/13/13 1212 Last data filed at 06/12/13 2143  Gross per 24 hour  Intake 1931.25 ml  Output    351 ml  Net 1580.25 ml   Filed Weights   06/11/13 2208 06/12/13 0500 06/13/13 0500  Weight: 187 lb 6.3 oz (85 kg) 187 lb 6.3 oz (85 kg) 183 lb 3.2 oz (83.1 kg)    Exam:   General:  Pt in NAD, Alert and awake  Cardiovascular: RRR, no MRG  Respiratory: CTA BL, no wheezes  Abdomen: soft, ND, NT, obese  Musculoskeletal: no cyanosis, edema, or clubbing   Data Reviewed: Basic Metabolic Panel:  Recent Labs Lab 06/11/13 1401 06/12/13 0455  NA 141 140  K 4.4 4.4  CL 102 105  CO2 26 23  GLUCOSE 85 78  BUN 11 13  CREATININE 0.49* 0.55  CALCIUM 9.2 8.3*   Liver Function Tests: No results found for this basename: AST, ALT, ALKPHOS,  BILITOT, PROT, ALBUMIN,  in the last 168 hours No results found for this basename: LIPASE, AMYLASE,  in the last 168 hours No results found for this basename: AMMONIA,  in the last 168 hours CBC:  Recent Labs Lab 06/11/13 1401 06/12/13 0455  WBC 7.6 6.9  NEUTROABS 3.4  --   HGB 13.7 12.6  HCT 40.2 37.5  MCV 88.7 89.5  PLT 261 235   Cardiac Enzymes: No results found for this basename: CKTOTAL, CKMB, CKMBINDEX, TROPONINI,  in the last 168 hours BNP (last 3 results) No results found for this basename: PROBNP,  in the last 8760 hours CBG: No results found for this basename: GLUCAP,  in the last 168 hours  No results found for this or any previous visit (from the past 240 hour(s)).   Studies: Ct Temporal Bones W/o Cm  06/13/2013   CLINICAL DATA:  Vertigo.  Abnormal head CT.  EXAM: CT TEMPORAL BONES WITHOUT CONTRAST  TECHNIQUE: Axial and coronal plane CT imaging of the petrous temporal bones was performed with thin-collimation image reconstruction. No intravenous contrast was administered. Multiplanar CT image reconstructions were also generated.  COMPARISON:  06/04/2013.  MRI 04/12/2013.  FINDINGS: Mastoid air cells are poorly developed bilaterally. There does not appear to be any inflammatory change in the mastoid air cell regions. Middle ears are both clear. Ossicular chains appear normal. No fluid in the attic. Inner ear structures appear normal.  Air cells of the petrous apex on the left are not  pneumatized. There is no evidence of bone destruction. Air cells at the petrous apex on the right are partially pneumatized. There is some fluid/soft tissue density in that region. The finding is entirely nonspecific. This could simply represent partially pneumatized normal petrous apex. The possibility of trapped fluid or petrous apicitis is does exist, but the diagnosis would rely on the clinical features.  There is mild mucosal thickening affecting the maxillary sinuses. There is chronic  opacification of the right division of the sphenoid sinus.  IMPRESSION: No convincing CT evidence of significant petrous apex pathology. On the left, the petrous apex does not appear pneumatized. Usually, this is the baseline appearance. One could not rule out fluid within previously pneumatized air cells, but that is less likely. There is no bone destruction. On the right, there is a partially aerated petrous apex. Again, this could be the baseline appearance or there could be some trapped fluid or even inflammatory change. There is no specific imaging finding that would suggest significant inflammation however and that would remain largely a clinical diagnosis.   Electronically Signed   By: Paulina FusiMark  Munoz M.D.   On: 06/13/2013 10:09    Scheduled Meds: . enoxaparin (LOVENOX) injection  40 mg Subcutaneous Q24H  . FLUoxetine  20 mg Oral Daily  . LORazepam  0.25 mg Oral QHS  . multivitamin with minerals  1 tablet Oral Daily  . sodium chloride  3 mL Intravenous Q12H   Continuous Infusions:    Principal Problem:   Vertigo Active Problems:   Anxiety and depression   Near syncope   Dizziness    Time spent: > 35minutes    Parke SimmersFrancis, Jane L  [facility] Pager [phone]. If 7PM-7AM, please contact night-coverage at [email or web address], password [email or web address] 06/13/2013, 12:12 PM  LOS: 2 days

## 2013-06-13 NOTE — Progress Notes (Signed)
Physical Therapy Treatment Patient Details Name: Jane Munoz MRN: [id number] DOB: [birth year] Today's Date: 06/13/2013 Time: 1345-1410 PT Time Calculation (min): 25 min  PT Assessment / Plan / Recommendation  History of Present Illness Jane Munoz is a 43 y.o. African female with history of depression and anxiety who presents with the above complaints.  Patient reports that she has had dizziness for the last 3 months since starting of November.  She has had extensive workup in the past including neurology and cardiology evaluation which was negative.  She was also recently hospitalized from 06/04/[facility] 06/05/2013 for similar complaints.  Patient has also been recently evaluated by [facility].  She has seen physical therapy only once so far, due to ongoing dizziness with worsening symptoms at home, she presented to the emergency department for further evaluation.  She denies any recent fevers, recent episode of chest pain, shortness of breath, abdominal pain, headaches or vision changes.  Does complain of some nausea and vomiting.  She does note that her dizziness is worse with changing head positions, especially looking up at the ceiling or getting up.  If she keeps her head still her dizziness improves after period time.  She reports that she wakes up from sleep due to dizziness when she turns her head in sleep.  Due to her ongoing symptoms, hospitalist service was asked to admit the patient for further care and management.  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   PT Comments   Pt talkative upon arrival to room for session.  Cooperative with session, but continues to present with bizarre an unusual signs and symptoms (detailed below).  I cannot confirm or deny peripheral or central vestibular contribution to her case.  She reports dizziness, presents  with unusual and atypical eye movements associated with positional changes and dry-heaves upon sitting, all of which are inconsistent with a peripheral presentation.  Agree with psych consult to investigate alternative explanation.  Will follow up over next few days.   Follow Up Recommendations  Outpatient PT (???)     Does the patient have the potential to tolerate intense rehabilitation     Barriers to Discharge        Equipment Recommendations       Recommendations for Other Services    Frequency Min 2X/week   Progress towards PT Goals Progress towards PT goals: Not progressing toward goals - comment  Plan Other (comment) (it is unclear if PT is indicated in this case)    Precautions / Restrictions Precautions Precautions: Fall Precaution Comments: due to inconsistent nature of presentation, pt should have staff assist when OOB to prevent fall   Pertinent Vitals/Pain no apparent distress     Mobility  Bed Mobility Overal bed mobility: Needs Assistance Bed Mobility: Supine to Sit Supine to sit: Min assist General bed mobility comments: Assessed head rolling initially with bed flat, to assess for horizontal canal involvement.  Pt immediately demonstrated eyes closing states she cannot keep the eyes open; eyes crossing, rolling upward, does not resolve with return to neutral. Attempted to assess oculomotor function, pt could not/would not keep eyes open consistently; inconsistently followed L<>R with crossing or upward rolling intermittently observed.  Transitoned into sitting; Pt weakly assists, unable to maintain sitting 10 seconds before eyes roll upward, pt dry-heaves, and then and pt begins to slump to side.  Returned to  flat and HOB elevate 30 degree Transfers Transfers:  (did not attempt given presentation at bed level)    Exercises     PT Diagnosis:    PT Problem List:   PT Treatment Interventions:     PT Goals (current goals can now be found in the care plan section)     Visit Information  Last PT Received On: 06/13/13 Assistance Needed: +1 History of Present Illness: Jane Munoz is a 43 y.o. African female with history of depression and anxiety who presents with the above complaints.  Patient reports that she has had dizziness for the last 3 months since starting of November.  She has had extensive workup in the past including neurology and cardiology evaluation which was negative.  She was also recently hospitalized from 06/04/[facility] 06/05/2013 for similar complaints.  Patient has also been recently [facility].  She has seen physical therapy only once so far, due to ongoing dizziness with worsening symptoms at home, she presented to the emergency department for further evaluation.  She denies any recent fevers, recent episode of chest pain, shortness of breath, abdominal pain, headaches or vision changes.  Does complain of some nausea and vomiting.  She does note that her dizziness is worse with changing head positions, especially looking up at the ceiling or getting up.  If she keeps her head still her dizziness improves after period time.  She reports that she wakes up from sleep due to dizziness when she turns her head in sleep.  Due to her ongoing symptoms, hospitalist service was asked to admit the patient for further care and management.  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    Subjective Data  Subjective: I felt just fine this morning.   Cognition  Cognition Arousal/Alertness: Lethargic Behavior During Therapy: Flat affect Overall Cognitive Status: Within Functional Limits for tasks assessed    Balance  Balance Overall balance assessment: Needs assistance Sitting-balance support: Bilateral upper extremity supported;Feet supported Sitting balance-Leahy Scale: Fair Sitting balance -  Comments: As noted above, with slumping to left shortly after sitting EOB.   Postural control: Left lateral lean  End of Session PT - End of Session Activity Tolerance: Patient limited by fatigue Patient left: in bed;with family/visitor present   GP Functional Assessment Tool Used: clinical judgement Functional Limitation: Other PT subsequent Other PT Secondary Current Status (Z6109(G8993): At least 20 percent but less than 40 percent impaired, limited or restricted Other PT Secondary Goal Status (U0454(G8994): At least 1 percent but less than 20 percent impaired, limited or restricted   Dennis BastMartin, Miho Monda Galloway 06/13/2013, 2:23 PM

## 2013-06-14 LAB — HIV ANTIBODY (ROUTINE TESTING W REFLEX): HIV: NONREACTIVE

## 2013-06-14 MED ORDER — MECLIZINE HCL 50 MG PO TABS: 50.0000 mg | ORAL_TABLET | Freq: Two times a day (BID) | ORAL | Status: DC | PRN

## 2013-06-14 NOTE — Progress Notes (Signed)
Pt/family given discharge instructions, medication lists, follow up appointments, and when to call the doctor.  Pt/family verbalizes understanding. Trishia Cuthrell McClintock    

## 2013-06-14 NOTE — Progress Notes (Signed)
Physical Therapy Treatment Patient Details Name: Jane Munoz MRN: [id number] DOB: 09-16-1970 Today's Date: 06/14/2013 Time: 4098-1191 PT Time Calculation (min): 20 min  PT Assessment / Plan / Recommendation  History of Present Illness See prior notes for full history.   PT Comments   Pt lethargic on arrival. Reports she was fine earlier today and walked to bathroom with nursing. Currently with difficulty holding her eyes open. States she feels like she's floating. Noted she has not had any medications this morning. Pt able to sit up at EOB with rail, however immediately slowly "collapsed" back down into supine (she did lift her Rt leg back onto bed as she collapsed). Eyes closed and when asked what happened, she began blinking eyes, eyes then crossed and then rolled up. This eye pattern repeated x3. No nystagmus noted.   Pt again sat EOB for a total of 5 minutes. Remained lethargic. Twice closing eyes and beginning "slow collapse" towards sidelying, however easily stopped with light touch to her shoulder and cue to open eyes. Reported floor was spinning and demonstrated a counter-clockwise direction. Dry heave x 1 and reported she had to lie back down. No nystagmus noted during episode.   Pt reported prior PT interventions have made her worse (Lt Epley maneuver and horizontal head rolls). Current symptoms are not consistent with BPPV (or any other peripheral or central cause of vertigo known to this PT). Further OPPT may be of benefit as they have some equipment for assessment not available here (Frenzel lenses, BalanceMaster), however anticipate it may only rule out other causes.    Follow Up Recommendations  Other (comment) (?outpatient PT is appropriate)     Does the patient have the potential to tolerate intense rehabilitation     Barriers to Discharge        Equipment Recommendations  None recommended by PT    Recommendations for Other Services    Frequency Min 2X/week   Progress  towards PT Goals Progress towards PT goals: Not progressing toward goals - comment (dizziness, pre-syncope limiting mobility)  Plan Other (comment) (it is unclear if PT is indicated in this case)    Precautions / Restrictions Precautions Precautions: Fall Precaution Comments: due to inconsistent nature of presentation, pt should have staff assist when OOB to prevent fall   Pertinent Vitals/Pain Dizziness 10/10 with dry heaves     Mobility  Bed Mobility Overal bed mobility: Needs Assistance Bed Mobility: Rolling;Sidelying to Sit;Sit to Sidelying Rolling: Modified independent (Device/Increase time) Sidelying to sit: Supervision Sit to sidelying: Supervision General bed mobility comments: supervision for safety. Repeated x 2 Transfers General transfer comment: not tested due to pt presentation    Exercises     PT Diagnosis:    PT Problem List:   PT Treatment Interventions:     PT Goals (current goals can now be found in the care plan section)    Visit Information  Last PT Received On: 06/14/13 Assistance Needed: +1 History of Present Illness: Jane Munoz is a 43 y.o. African female with history of depression and anxiety who presents with the above complaints.  Patient reports that she has had dizziness for the last 3 months since starting of November.  She has had extensive workup in the past including neurology and cardiology evaluation which was negative.  She was also recently hospitalized from 06/04/2013 [facility] 06/05/2013 for similar complaints.  Patient has also been recently evaluated by [facility].  She has seen physical therapy only once so far, due to ongoing  dizziness with worsening symptoms at home, she presented to the emergency department for further evaluation.  She denies any recent fevers, recent episode of chest pain, shortness of breath, abdominal pain, headaches or vision changes.  Does complain of some nausea and vomiting.  She does note that her dizziness is  worse with changing head positions, especially looking up at the ceiling or getting up.  If she keeps her head still her dizziness improves after period time.  She reports that she wakes up from sleep due to dizziness when she turns her head in sleep.  Due to her ongoing symptoms, hospitalist service was asked to admit the patient for further care and management.  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    Subjective Data  Subjective: Reports she felt fine earlier this moring and walked to bathroom with nursing   Cognition  Cognition Arousal/Alertness: Lethargic Behavior During Therapy: Flat affect Overall Cognitive Status: Within Functional Limits for tasks assessed    Balance     End of Session PT - End of Session Activity Tolerance: Patient limited by fatigue;Treatment limited secondary to medical complications (Comment) Patient left: in bed;with call bell/phone within reach;with bed alarm set;with family/visitor present Nurse Communication: Mobility status;Other (comment) (nausea, dry heaves, wants to see her RN)   GP     Aladdin Kollmann 06/14/2013, 11:02 AM Pager [phone]

## 2013-06-14 NOTE — Discharge Summary (Signed)
Physician Discharge Summary  Arshi Duarte ZOX:[id number] DOB: 1970/12/26 DOA: 06/11/2013  PCP: Pcp Not In System  Admit date: 06/11/2013 Discharge date: 06/14/2013  Time spent: > 35 minutes  Recommendations for Outpatient Follow-up:  1. Patient will need outpatient physical therapy services 2. Continued follow up with ENT for further evaluation and recommendations  Discharge Diagnoses:  Principal Problem:   Vertigo Active Problems:   Anxiety and depression   Near syncope   Dizziness   Discharge Condition: Stable  Diet recommendation: Heart healthy diet  Filed Weights   06/12/13 0500 06/13/13 0500 06/14/13 0700  Weight: 85 kg (187 lb 6.3 oz) 83.1 kg (183 lb 3.2 oz) 80.876 kg (178 lb 4.8 oz)    History of present illness/Hospital Course::  43 year old [place] American female with history of depression and anxiety who presented to the ED complaining of dizziness. The dizziness has been chronic and she reportedly has had extensive workup in the past including neurology evaluation and cardiology evaluation which was negative. Also recently seen by [facility] ENT. Presented this admission with the same complaints. While here was evaluated by neurology who recommended no further inpatient evaluation and recommended outpatient physical therapy evaluation. Are physical therapists evaluated patient while in house who recommended the same period. I will plan on discharging patient on increased dose of meclizine. Currently physical therapy not recommending any equipments on discharge.  Will recommend on discharge the patient followup with outpatient physical therapy of which I will consult my care manager to help set up.  Also recommend she followup with ear nose and throat specialist for further evaluation and recommendations.  While in house patient had CT scan of temporal bone given that prior admission there was mention of recommendation for this test. It reported no bone destruction and  reported that there is no specific imaging findings would suggest significant inflammation.   Procedures:  CT of temporal bones please see below  Consultations:  Neurology  Psychiatry  Discharge Exam: Filed Vitals:   06/14/13 1054  BP: 118/80  Pulse:   Temp:   Resp:     General: Pt in NAD, alert and awake Cardiovascular: RRR, no MRG Respiratory: CTA BL, no wheezes  Discharge Instructions  Discharge Orders   Future Appointments Provider Department Dept Phone   08/16/2013 3:00 PM Huston Foley, MD [facility] [phone]   Future Orders Complete By Expires   Call MD for:  difficulty breathing, headache or visual disturbances  As directed    Call MD for:  persistant dizziness or light-headedness  As directed    Call MD for:  temperature >100.4  As directed    Diet - low sodium heart healthy  As directed    Discharge instructions  As directed    Comments:     Please followup with your ears nose and throat physician for further evaluation and recommendations. Also you'll need outpatient physical therapy.   Increase activity slowly  As directed        Medication List         diazepam 5 MG tablet  Commonly known as:  VALIUM  Take 1 tablet (5 mg total) by mouth 2 (two) times daily.     FLUoxetine 20 MG capsule  Commonly known as:  PROZAC  Take 20 mg by mouth daily.     ibuprofen 200 MG tablet  Commonly known as:  ADVIL,MOTRIN  Take 800 mg by mouth every 6 (six) hours as needed for mild pain.     LORazepam 0.5  MG tablet  Commonly known as:  ATIVAN  Take 0.25 mg by mouth at bedtime.     meclizine 50 MG tablet  Commonly known as:  ANTIVERT  Take 1 tablet (50 mg total) by mouth 2 (two) times daily as needed for dizziness.     multivitamin with minerals Tabs tablet  Take 1 tablet by mouth daily.       No Known Allergies     Follow-up Information   Schedule an appointment as soon as possible for a visit with your doctor.       The  results of significant diagnostics from this hospitalization (including imaging, microbiology, ancillary and laboratory) are listed below for reference.    Significant Diagnostic Studies: Dg Chest 2 View  06/04/2013   CLINICAL DATA:  Syncopal episode earlier today. Headache. Nausea. Cough.  EXAM: CHEST  2 VIEW  COMPARISON:  DG CHEST 2 VIEW dated 03/22/2013; CT ANGIO CHEST W/CM &/OR WO/CM dated 03/01/2013; DG CHEST 2 VIEW dated 03/01/2013  FINDINGS: Cardiomediastinal silhouette unremarkable and unchanged. Lungs clear. Bronchovascular markings normal. Pulmonary vascularity normal. No visible pleural effusions. No pneumothorax. Mild degenerative changes involving the thoracic spine.  IMPRESSION: No acute cardiopulmonary disease.   Electronically Signed   By: Hulan Saas M.D.   On: 06/04/2013 17:39   Ct Head Wo Contrast  06/04/2013   CLINICAL DATA:  Dizziness for 3 months. Patient had dozing off episodes at the ENT office visit today. New onset cough and chest pressure.  EXAM: CT HEAD WITHOUT CONTRAST  TECHNIQUE: Contiguous axial images were obtained from the base of the skull through the vertex without intravenous contrast.  COMPARISON:  Brain MRI, 04/12/2013.  FINDINGS: Ventricles are normal in size and configuration. There are no parenchymal masses or mass effect. There are no areas of abnormal parenchymal attenuation there is no evidence of a cortical infarct.  There are no extra-axial masses or abnormal fluid collections.  There is no intracranial hemorrhage  Visualized sinuses, mastoid air cells and middle ear cavities are clear.  There is irregularity along the left petrous apex adjacent to the horizontal portion of the left internal carotid canal. This may be due to chronic fluid in the petrous apex air cells. However, given the symptoms and lack of other findings, this could reflect a more aggressive inflammatory process, and a follow-up high-resolution unenhanced temporal bone CT may be helpful.   IMPRESSION: No intracranial abnormality.  Opacification of left petrous apex air cells with the suggestion of bony irregularity. Consider a follow-up temporal bone CT as detailed above.   Electronically Signed   By: Amie Portland M.D.   On: 06/04/2013 17:51   Ct Temporal Bones W/o Cm  06/13/2013   CLINICAL DATA:  Vertigo.  Abnormal head CT.  EXAM: CT TEMPORAL BONES WITHOUT CONTRAST  TECHNIQUE: Axial and coronal plane CT imaging of the petrous temporal bones was performed with thin-collimation image reconstruction. No intravenous contrast was administered. Multiplanar CT image reconstructions were also generated.  COMPARISON:  06/04/2013.  MRI 04/12/2013.  FINDINGS: Mastoid air cells are poorly developed bilaterally. There does not appear to be any inflammatory change in the mastoid air cell regions. Middle ears are both clear. Ossicular chains appear normal. No fluid in the attic. Inner ear structures appear normal.  Air cells of the petrous apex on the left are not pneumatized. There is no evidence of bone destruction. Air cells at the petrous apex on the right are partially pneumatized. There is some fluid/soft tissue density in  that region. The finding is entirely nonspecific. This could simply represent partially pneumatized normal petrous apex. The possibility of trapped fluid or petrous apicitis is does exist, but the diagnosis would rely on the clinical features.  There is mild mucosal thickening affecting the maxillary sinuses. There is chronic opacification of the right division of the sphenoid sinus.  IMPRESSION: No convincing CT evidence of significant petrous apex pathology. On the left, the petrous apex does not appear pneumatized. Usually, this is the baseline appearance. One could not rule out fluid within previously pneumatized air cells, but that is less likely. There is no bone destruction. On the right, there is a partially aerated petrous apex. Again, this could be the baseline appearance or  there could be some trapped fluid or even inflammatory change. There is no specific imaging finding that would suggest significant inflammation however and that would remain largely a clinical diagnosis.   Electronically Signed   By: Paulina FusiMark  Shogry M.D.   On: 06/13/2013 10:09    Microbiology: No results found for this or any previous visit (from the past 240 hour(s)).   Labs: Basic Metabolic Panel:  Recent Labs Lab 06/11/13 1401 06/12/13 0455  NA 141 140  K 4.4 4.4  CL 102 105  CO2 26 23  GLUCOSE 85 78  BUN 11 13  CREATININE 0.49* 0.55  CALCIUM 9.2 8.3*   Liver Function Tests: No results found for this basename: AST, ALT, ALKPHOS, BILITOT, PROT, ALBUMIN,  in the last 168 hours No results found for this basename: LIPASE, AMYLASE,  in the last 168 hours No results found for this basename: AMMONIA,  in the last 168 hours CBC:  Recent Labs Lab 06/11/13 1401 06/12/13 0455  WBC 7.6 6.9  NEUTROABS 3.4  --   HGB 13.7 12.6  HCT 40.2 37.5  MCV 88.7 89.5  PLT 261 235   Cardiac Enzymes: No results found for this basename: CKTOTAL, CKMB, CKMBINDEX, TROPONINI,  in the last 168 hours BNP: BNP (last 3 results) No results found for this basename: PROBNP,  in the last 8760 hours CBG: No results found for this basename: GLUCAP,  in the last 168 hours     Signed:  Penny PiaVEGA, Zein Helbing  [facility] 06/14/2013, 12:07 PM

## 2013-06-14 NOTE — Care Management Note (Signed)
    Page 1 of 1   06/14/2013     2:46:56 PM   CARE MANAGEMENT NOTE 06/14/2013  Patient:  Jane Munoz,Jane Munoz   Account Number:  [id number]  Date Initiated:  06/14/2013  Documentation initiated by:  Maigan Bittinger  Subjective/Objective Assessment:   PT ADM ON 06/11/13 WITH VERTIGO AND DIZZINESS.  PTA, PT INDEPENDENT OF ADLS.     Action/Plan:   CM REFERRAL FOR OUTPATIENT PHYSICAL THERAPY SERVICES. WILLL COMPLETE NEUROREHAB REFERRAL FORM AND FAX TO OP CENTER.  PT GIVEN MAP AND PHONE #; CENTER TO CALL PT FOR APPT.   Anticipated DC Date:  06/14/2013   Anticipated DC Plan:  HOME/SELF CARE      DC Planning Services  CM consult      Choice offered to / List presented to:             Status of service:  Completed, signed off Medicare Important Message given?   (If response is "NO", the following Medicare IM given date fields will be blank) Date Medicare IM given:   Date Additional Medicare IM given:    Discharge Disposition:  HOME/SELF CARE  Per UR Regulation:  Reviewed for med. necessity/level of care/duration of stay  If discussed at Long Length of Stay Meetings, dates discussed:    Comments:  06/14/13 Jazmine Longshore,RN,BSN [phone] PT STATES SHE HAS RW OF HER OWN FOR HOME USE.

## 2013-06-15 NOTE — Consult Note (Signed)
Patient needs to be completely evaluated to rule out any organic pathology for her dizziness

## 2013-08-16 ENCOUNTER — Ambulatory Visit: Payer: BC Managed Care – PPO | Admitting: Neurology

## 2013-11-23 ENCOUNTER — Other Ambulatory Visit: Payer: Self-pay | Admitting: Emergency Medicine

## 2013-11-23 ENCOUNTER — Ambulatory Visit
Admission: RE | Admit: 2013-11-23 | Discharge: 2013-11-23 | Disposition: A | Discharge status: Home / Self Care | Payer: BC Managed Care – PPO | Source: Ambulatory Visit | Attending: Emergency Medicine | Admitting: Emergency Medicine

## 2013-11-23 DIAGNOSIS — S99922A Unspecified injury of left foot, initial encounter: Secondary | ICD-10-CM

## 2014-01-21 ENCOUNTER — Other Ambulatory Visit (HOSPITAL_COMMUNITY): Payer: Self-pay | Admitting: Orthopedic Surgery

## 2014-01-21 DIAGNOSIS — M949 Disorder of cartilage, unspecified: Principal | ICD-10-CM

## 2014-01-21 DIAGNOSIS — M899 Disorder of bone, unspecified: Secondary | ICD-10-CM

## 2014-02-01 ENCOUNTER — Ambulatory Visit (HOSPITAL_COMMUNITY)
Admission: RE | Admit: 2014-02-01 | Discharge: 2014-02-01 | Disposition: A | Discharge status: Home / Self Care | Payer: BC Managed Care – PPO | Source: Ambulatory Visit | Attending: Orthopedic Surgery | Admitting: Orthopedic Surgery

## 2014-02-01 DIAGNOSIS — M899 Disorder of bone, unspecified: Secondary | ICD-10-CM

## 2014-02-01 DIAGNOSIS — Z13820 Encounter for screening for osteoporosis: Secondary | ICD-10-CM | POA: Diagnosis present

## 2014-02-01 DIAGNOSIS — M949 Disorder of cartilage, unspecified: Secondary | ICD-10-CM

## 2014-03-07 ENCOUNTER — Encounter (HOSPITAL_COMMUNITY): Payer: Self-pay | Admitting: Radiology

## 2014-08-11 ENCOUNTER — Ambulatory Visit (INDEPENDENT_AMBULATORY_CARE_PROVIDER_SITE_OTHER): Payer: 59 | Admitting: Family Medicine

## 2014-08-11 ENCOUNTER — Encounter: Payer: Self-pay | Admitting: Family Medicine

## 2014-08-11 VITALS — BP 100/80 | HR 100 | Temp 98.7°F | Ht 60.0 in | Wt 187.6 lb

## 2014-08-11 DIAGNOSIS — Z7689 Persons encountering health services in other specified circumstances: Secondary | ICD-10-CM

## 2014-08-11 DIAGNOSIS — F418 Other specified anxiety disorders: Secondary | ICD-10-CM | POA: Diagnosis not present

## 2014-08-11 DIAGNOSIS — F32A Depression, unspecified: Secondary | ICD-10-CM

## 2014-08-11 DIAGNOSIS — R002 Palpitations: Secondary | ICD-10-CM

## 2014-08-11 DIAGNOSIS — Z7189 Other specified counseling: Secondary | ICD-10-CM

## 2014-08-11 DIAGNOSIS — F419 Anxiety disorder, unspecified: Secondary | ICD-10-CM

## 2014-08-11 DIAGNOSIS — R42 Dizziness and giddiness: Secondary | ICD-10-CM

## 2014-08-11 DIAGNOSIS — E669 Obesity, unspecified: Secondary | ICD-10-CM

## 2014-08-11 DIAGNOSIS — H9193 Unspecified hearing loss, bilateral: Secondary | ICD-10-CM | POA: Diagnosis not present

## 2014-08-11 DIAGNOSIS — F329 Major depressive disorder, single episode, unspecified: Secondary | ICD-10-CM

## 2014-08-11 NOTE — Progress Notes (Signed)
HPI:  Jane Munoz is here to establish care. She has not really been under the care of family doctor in some time. She does see [employer] and report had physical with pap in 2015 and this was normal.   Has the following chronic problems that require follow up and concerns today:  Hx of Vertigo/Depression/Presyncoe/Palpitations: -these symptoms (vertigo, presyncope, palpitations) escalated in 2015 and were intermittent and severe at times -was seeing a neurologist at [facility] for the vertigo, reports extensive eval and told psychological, also had negative CV evaluation with cardoilogy and an ENT evaluation -now seeing a psychiatrist Mogeed Akintaio  -meds: wellbutrin, prozac -reports is doing better, no SI, headaches -palpitations and vertigo have resolved for the most part  Hearing Loss: -started 5 years ago, worsening the last 1 year -mild bilateral hearing loss and intermittent tinnitus -seeing audiologist for this -reports she was seeing ENT at [facility] and plans to follow up with them -hx of ear infections, hx of ringing in ears, reports grandfather has hearing aides -decreased hearing - soft and monotone voices are more difficult to hear -denies: pain in ear, popping, clicking, pulsatile sounds, pressure or fullness -does have chronic nasal congestion, worse in the fall  Obesity: -chronic -reports poor diet and no regular exercise -stable    ROS negative for unless reported above: fevers, unintentional weight loss, hearing or vision loss, chest pain, palpitations, struggling to breath, hemoptysis, melena, hematochezia, hematuria, falls, loc, si, thoughts of self harm  Past Medical History  Diagnosis Date  . Depression   . Anxiety   . H/O varicella   . History of measles, mumps, or rubella   . Blood transfusion   . H/O blood clots   . H/O bladder infections   . Leaking of urine   . Preterm labor   . H/O dysmenorrhea 05/08/11  . Adenomyosis 05/08/11  . Pelvic pain  1/2/`13  . Dizziness   . Heart murmur   . Colon polyps   . Elevated d-dimer- 1.29 on Oct 27th 2014- CTA neg for PE, LE venous dopplers neg for DVT 03/10/13 03/06/2013    Past Surgical History  Procedure Laterality Date  . Hernia repair    . Wisdom tooth extraction    . Cesarean section       x 3    Family History  Problem Relation Age of Onset  . Cancer Mother     Abdominal cancer  . Heart Problems Mother   . Diabetes Father   . Hypertension Father   . Asthma Sister   . Arthritis Sister   . Heart disease Mother   . Heart disease Sister   . CVA Maternal Aunt   . Diabetes Sister     History   Social History  . Marital Status: Divorced    Spouse Name: N/A  . Number of Children: 3  . Years of Education: Grad   Occupational History  . TEACHER    Social History Main Topics  . Smoking status: Never Smoker   . Smokeless tobacco: Never Used  . Alcohol Use: No  . Drug Use: No  . Sexual Activity: Not Currently   Other Topics Concern  . 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  buPROPion (WELLBUTRIN) 100 MG tablet, , Disp: , Rfl:  .  CALCIUM PO, Take by mouth., Disp: , Rfl:  .  FLUoxetine (PROZAC) 40 MG capsule, Take 40 mg by mouth daily., Disp: , Rfl:  .  Multiple Vitamin (MULTIVITAMIN WITH MINERALS) TABS, Take 1 tablet by mouth daily., Disp: , Rfl:  .  ibuprofen (ADVIL,MOTRIN) 200 MG tablet, Take 800 mg by mouth every 6 (six) hours as needed for mild pain., Disp: , Rfl:   EXAM:  Filed Vitals:   08/11/14 1123  BP: 100/80  Pulse: 100  Temp: 98.7 F (37.1 C)    Body mass index is 36.64 kg/(m^2).  GENERAL: vitals reviewed and listed above, alert,  oriented, appears well hydrated and in no acute distress  HEENT: atraumatic, conjunttiva clear, hearing grossly intact, no obvious abnormalities on inspection of external nose and ears  NECK: no obvious masses on inspection  LUNGS: clear to auscultation bilaterally, no wheezes, rales or rhonchi, good air movement  CV: HRRR, no peripheral edema  MS: moves all extremities without noticeable abnormality  PSYCH: pleasant and cooperative, no obvious depression or anxiety  ASSESSMENT AND PLAN:  Discussed the following assessment and plan:  Hearing loss, bilateral -advised since worsening that she follow up with her ENT doctor for further evaluation, no sig signs of hearing difficulty on exam  Anxiety and depression -cont care with psychiatrist  Vertigo Palpitations -resolved  Obesity: -advised healthy diet and regular exercise -advised diabetes and lipid screening at physical   Encounter to establish care -We reviewed the PMH, PSH, FH, SH, Meds and Allergies. -We provided refills for any medications we will prescribe as needed. -We addressed current concerns per orders and patient instructions. -We have asked for records for pertinent exams, studies, vaccines and notes from previous providers. -We have advised patient to follow up per instructions below. -will need general medical exam - advised to schedule in 3-4 months, will do basic labs then  -Patient advised to return or notify a doctor immediately if symptoms worsen or persist or new concerns arise.  Patient Instructions  BEFORE YOU LEAVE: -physical exam in about 3-4 months - please come fasting  We recommend the following healthy lifestyle measures: - eat a healthy diet consisting of lots of vegetables, fruits, beans, nuts, seeds, healthy meats such as white chicken and fish and whole grains.  - avoid fried foods, fast food, processed foods, sodas, red meet and other fattening foods.  - get a least 150 minutes of  aerobic exercise per week.        Kriste BasqueKIM, Idaly Verret R.

## 2014-08-11 NOTE — Patient Instructions (Signed)
BEFORE YOU LEAVE: -physical exam in about 3-4 months - please come fasting  We recommend the following healthy lifestyle measures: - eat a healthy diet consisting of lots of vegetables, fruits, beans, nuts, seeds, healthy meats such as white chicken and fish and whole grains.  - avoid fried foods, fast food, processed foods, sodas, red meet and other fattening foods.  - get a least 150 minutes of aerobic exercise per week.

## 2014-08-11 NOTE — Progress Notes (Signed)
Pre visit review using our clinic review tool, if applicable. No additional management support is needed unless otherwise documented below in the visit note. 

## 2014-09-02 ENCOUNTER — Encounter: Payer: Self-pay | Admitting: Family Medicine

## 2014-09-02 ENCOUNTER — Ambulatory Visit (INDEPENDENT_AMBULATORY_CARE_PROVIDER_SITE_OTHER): Payer: 59 | Admitting: Family Medicine

## 2014-09-02 ENCOUNTER — Telehealth: Payer: Self-pay | Admitting: *Deleted

## 2014-09-02 VITALS — BP 100/78 | HR 88 | Temp 98.1°F | Ht 60.0 in

## 2014-09-02 DIAGNOSIS — H9193 Unspecified hearing loss, bilateral: Secondary | ICD-10-CM | POA: Diagnosis not present

## 2014-09-02 DIAGNOSIS — F418 Other specified anxiety disorders: Secondary | ICD-10-CM | POA: Diagnosis not present

## 2014-09-02 DIAGNOSIS — F329 Major depressive disorder, single episode, unspecified: Secondary | ICD-10-CM

## 2014-09-02 DIAGNOSIS — F419 Anxiety disorder, unspecified: Secondary | ICD-10-CM

## 2014-09-02 DIAGNOSIS — R42 Dizziness and giddiness: Secondary | ICD-10-CM | POA: Diagnosis not present

## 2014-09-02 NOTE — Patient Instructions (Signed)
Please call your psychiatrist today for an appointment to address your worsening stress. Please seek emergency care if worsening depression or serious psychiatric illness.  Please schedule a appointment/follow up with your ear nose and throat doctor and neurologist about your vertigo and your hearing loss and tinnitus.  Follow up with us as needed.

## 2014-09-02 NOTE — Progress Notes (Signed)
Pre visit review using our clinic review tool, if applicable. No additional management support is needed unless otherwise documented below in the visit note. 

## 2014-09-02 NOTE — Telephone Encounter (Signed)
I called the pt and advised her to come in to the office at her convenience to sign a records release to have the records sent from [facility] that Dr Selena BattenKim requested and she agreed.

## 2014-09-02 NOTE — Progress Notes (Addendum)
HPI:  Jane Munoz is an anxious and pleasant 44 yo F recent new pt to our practice here for an acute visit for:  Vertigo: -intermittent, severe at times and chronic, extensive evaluation in the past with Cardiology, Neurology and ENT -determined to be psychological per her report and she is now under the care of psychiatrist Carney Living - was doing well recently - but has worsening depressed mood and anxiety recently with stress at school -denies SI or panic disorder -also has neurologist at [facility] neuro and at [facility] and ENT at [facility] -see prior notes for further details  -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  Past Medical History  Diagnosis Date  . Anxiety and depression   . Vertigo   . H/O varicella   . Blood transfusion   . H/O blood clots     DVT with pregnancy  . H/O bladder infections   . Leaking of urine   . Preterm labor   . H/O dysmenorrhea 05/08/11  . Adenomyosis 05/08/11  . Pelvic pain 1/2/`13  . Heart murmur   . Colon polyps   . Elevated d-dimer- 1.29 on Oct 27th 2014- CTA neg for PE, LE venous dopplers neg for DVT 03/10/13 03/06/2013    Past Surgical History  Procedure Laterality Date  . Hernia repair    . Wisdom tooth extraction    . Cesarean section       x 3    Family History  Problem Relation Age of Onset  . Cancer Mother     Abdominal cancer  . Heart Problems Mother   . Diabetes Father   . Hypertension Father   . Asthma Sister   . Arthritis Sister   . Heart disease Mother   . Heart disease Sister   . CVA Maternal Aunt   . Diabetes Sister     History    Social History  . Marital Status: Divorced    Spouse Name: N/A  . Number of Children: 3  . Years of Education: Grad   Occupational History  . TEACHER    Social History Main Topics  . Smoking status: Never Smoker   . Smokeless tobacco: Never Used  . Alcohol Use: No  . Drug Use: No  . Sexual Activity: Not Currently   Other Topics Concern  . 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  buPROPion (WELLBUTRIN) 100 MG tablet, , Disp: , Rfl:  .  CALCIUM PO, Take by mouth., Disp: , Rfl:  .  FLUoxetine (PROZAC) 40 MG capsule, Take 40 mg by mouth daily., Disp: , Rfl:  .  ibuprofen (ADVIL,MOTRIN) 200 MG tablet, Take 800 mg by mouth every 6 (six) hours as needed for mild pain., Disp: ,  Rfl:  .  Multiple Vitamin (MULTIVITAMIN WITH MINERALS) TABS, Take 1 tablet by mouth daily., Disp: , Rfl:   EXAM:  Filed Vitals:   09/02/14 1401  BP: 100/78  Pulse: 88  Temp: 98.1 F (36.7 C)    There is no weight on file to calculate BMI.  GENERAL: vitals reviewed and listed above, alert, oriented, appears well hydrated and in no acute distress  HEENT: atraumatic, conjunttiva clear, no obvious abnormalities on inspection of external nose and ears, PERRLA, normal inspection of nose and oropharynx, scarring and deformity of R TM - she reports is chronic  NECK: no obvious masses on inspection, no bruit  LUNGS: clear to auscultation bilaterally, no wheezes, rales or rhonchi, good air movement  CV: HRRR, no peripheral edema  MS: moves all extremities without noticeable abnormality  PSYCH: pleasant and cooperative, flat and depressed mood  NEURO: CN II-XII grossly intact, finger to  nose normal  ASSESSMENT AND PLAN:  Discussed the following assessment and plan:  Vertigo  Anxiety and depression  Hearing loss, bilateral  -discussed tx options for vertigo but she report medications and vestibular rehab have not helped -since has been determined ot be psychological per her report and worsening stress advised follow up with her psychiatrist and with neurology if persists and perhaps they have something else to offer -advised follow up with her ENT for her chronic hearing loss and tinittus -advised assistant to obtain records of from [facility] -Patient advised to return or notify a doctor immediately if symptoms worsen or persist or new concerns arise.  Patient Instructions  Please call your psychiatrist today for an appointment to address your worsening stress. Please seek emergency care if worsening depression or serious psychiatric illness.  Please schedule a appointment/follow up with your ear nose and throat doctor and neurologist about your vertigo and your hearing loss and tinnitus.  Follow up with us as needed.     Kriste BasqueKIM, HANNAH R.

## 2014-09-02 NOTE — Progress Notes (Signed)
Weight not measured today due to vertigo.

## 2014-11-05 ENCOUNTER — Encounter (HOSPITAL_COMMUNITY): Payer: Self-pay | Admitting: Emergency Medicine

## 2014-11-05 ENCOUNTER — Emergency Department (HOSPITAL_COMMUNITY)
Admission: EM | Admit: 2014-11-05 | Discharge: 2014-11-06 | Disposition: A | Discharge status: Home / Self Care | Payer: 59 | Attending: Emergency Medicine | Admitting: Emergency Medicine

## 2014-11-05 DIAGNOSIS — F419 Anxiety disorder, unspecified: Secondary | ICD-10-CM | POA: Insufficient documentation

## 2014-11-05 DIAGNOSIS — Z9889 Other specified postprocedural states: Secondary | ICD-10-CM | POA: Diagnosis not present

## 2014-11-05 DIAGNOSIS — F329 Major depressive disorder, single episode, unspecified: Secondary | ICD-10-CM | POA: Diagnosis not present

## 2014-11-05 DIAGNOSIS — R1033 Periumbilical pain: Secondary | ICD-10-CM | POA: Diagnosis present

## 2014-11-05 DIAGNOSIS — Z86010 Personal history of colonic polyps: Secondary | ICD-10-CM | POA: Diagnosis not present

## 2014-11-05 DIAGNOSIS — Z86718 Personal history of other venous thrombosis and embolism: Secondary | ICD-10-CM | POA: Insufficient documentation

## 2014-11-05 DIAGNOSIS — R112 Nausea with vomiting, unspecified: Secondary | ICD-10-CM | POA: Diagnosis not present

## 2014-11-05 DIAGNOSIS — Z79899 Other long term (current) drug therapy: Secondary | ICD-10-CM | POA: Diagnosis not present

## 2014-11-05 DIAGNOSIS — R011 Cardiac murmur, unspecified: Secondary | ICD-10-CM | POA: Insufficient documentation

## 2014-11-05 DIAGNOSIS — Z87448 Personal history of other diseases of urinary system: Secondary | ICD-10-CM | POA: Insufficient documentation

## 2014-11-05 DIAGNOSIS — Z8719 Personal history of other diseases of the digestive system: Secondary | ICD-10-CM | POA: Insufficient documentation

## 2014-11-05 DIAGNOSIS — Z8742 Personal history of other diseases of the female genital tract: Secondary | ICD-10-CM | POA: Insufficient documentation

## 2014-11-05 LAB — URINALYSIS, ROUTINE W REFLEX MICROSCOPIC
Bilirubin Urine: NEGATIVE
Glucose, UA: NEGATIVE mg/dL
Hgb urine dipstick: NEGATIVE
KETONES UR: NEGATIVE mg/dL
Leukocytes, UA: NEGATIVE
Nitrite: NEGATIVE
PROTEIN: NEGATIVE mg/dL
SPECIFIC GRAVITY, URINE: 1.015 (ref 1.005–1.030)
UROBILINOGEN UA: 1 mg/dL (ref 0.0–1.0)
pH: 5.5 (ref 5.0–8.0)

## 2014-11-05 LAB — COMPREHENSIVE METABOLIC PANEL
ALT: 19 U/L (ref 14–54)
ANION GAP: 7 (ref 5–15)
AST: 20 U/L (ref 15–41)
Albumin: 3.2 g/dL — ABNORMAL LOW (ref 3.5–5.0)
Alkaline Phosphatase: 115 U/L (ref 38–126)
BUN: 6 mg/dL (ref 6–20)
CALCIUM: 9 mg/dL (ref 8.9–10.3)
CHLORIDE: 107 mmol/L (ref 101–111)
CO2: 26 mmol/L (ref 22–32)
Creatinine, Ser: 0.53 mg/dL (ref 0.44–1.00)
GFR calc non Af Amer: >60 mL/min (ref >60)
GLUCOSE: 90 mg/dL (ref 65–99)
POTASSIUM: 3.4 mmol/L — AB (ref 3.5–5.1)
Sodium: 140 mmol/L (ref 135–145)
TOTAL PROTEIN: 7.1 g/dL (ref 6.5–8.1)
Total Bilirubin: 0.3 mg/dL (ref 0.3–1.2)

## 2014-11-05 LAB — CBC WITH DIFFERENTIAL/PLATELET
BASOS PCT: 1 % (ref 0–1)
Basophils Absolute: 0.1 10*3/uL (ref 0.0–0.1)
Eosinophils Absolute: 0.2 10*3/uL (ref 0.0–0.7)
Eosinophils Relative: 3 % (ref 0–5)
HEMATOCRIT: 41.3 % (ref 36.0–46.0)
Hemoglobin: 13.7 g/dL (ref 12.0–15.0)
LYMPHS ABS: 2.3 10*3/uL (ref 0.7–4.0)
LYMPHS PCT: 36 % (ref 12–46)
MCH: 29.3 pg (ref 26.0–34.0)
MCHC: 33.2 g/dL (ref 30.0–36.0)
MCV: 88.4 fL (ref 78.0–100.0)
Monocytes Absolute: 0.4 10*3/uL (ref 0.1–1.0)
Monocytes Relative: 6 % (ref 3–12)
NEUTROS ABS: 3.4 10*3/uL (ref 1.7–7.7)
NEUTROS PCT: 54 % (ref 43–77)
Platelets: 267 10*3/uL (ref 150–400)
RBC: 4.67 MIL/uL (ref 3.87–5.11)
RDW: 13.4 % (ref 11.5–15.5)
WBC: 6.4 10*3/uL (ref 4.0–10.5)

## 2014-11-05 LAB — LIPASE, BLOOD: Lipase: 27 U/L (ref 22–51)

## 2014-11-05 MED ORDER — ACETAMINOPHEN 500 MG PO TABS
1000.0000 mg | ORAL_TABLET | Freq: Once | ORAL | Status: AC
  Administered 2014-11-05: 1000 mg via ORAL
  Filled 2014-11-05: qty 2

## 2014-11-05 MED ORDER — ONDANSETRON HCL 4 MG/2ML IJ SOLN
4.0000 mg | Freq: Once | INTRAMUSCULAR | Status: AC
  Administered 2014-11-05: 4 mg via INTRAVENOUS
  Filled 2014-11-05: qty 2

## 2014-11-05 MED ORDER — SODIUM CHLORIDE 0.9 % IV BOLUS (SEPSIS)
500.0000 mL | Freq: Once | INTRAVENOUS | Status: AC
  Administered 2014-11-05: 500 mL via INTRAVENOUS

## 2014-11-05 MED ORDER — HYOSCYAMINE SULFATE 0.5 MG/ML IJ SOLN
0.1250 mg | Freq: Once | INTRAMUSCULAR | Status: AC
  Administered 2014-11-06: 0.125 mg via INTRAVENOUS
  Filled 2014-11-05: qty 0.25

## 2014-11-05 NOTE — ED Notes (Signed)
Pt. reports mid abdominal pain with nausea and vomitting onset this evening , denies diarrhea , no fever or chills.

## 2014-11-05 NOTE — ED Provider Notes (Signed)
CSN: [id number]     Arrival date & time 11/05/14  2131 History   First MD Initiated Contact with Patient 11/05/14 2210     Chief Complaint  Patient presents with  . Abdominal Pain  . Emesis     (Consider location/radiation/quality/duration/timing/severity/associated sxs/prior Treatment) Patient is a 44 y.o. female presenting with abdominal pain. The history is provided by the patient. No language interpreter was used.  Abdominal Pain Pain location:  Periumbilical Associated symptoms: nausea and vomiting   Associated symptoms: no chest pain, no chills, no diarrhea, no dysuria, no fever and no shortness of breath   Associated symptoms comment:  Patient presents with epigastric and periumbilical abdominal pain, nausea and vomiting that started 3 hours prior to arrival. No fever or diarrhea. She reports some mild constipation recently but had a bowel movement around 9:00 pm tonight.    Past Medical History  Diagnosis Date  . Anxiety and depression   . Vertigo   . H/O varicella   . Blood transfusion   . H/O blood clots     DVT with pregnancy  . H/O bladder infections   . Leaking of urine   . Preterm labor   . H/O dysmenorrhea 05/08/11  . Adenomyosis 05/08/11  . Pelvic pain 1/2/`13  . Heart murmur   . Colon polyps   . Elevated d-dimer- 1.29 on Oct 27th 2014- CTA neg for PE, LE venous dopplers neg for DVT 03/10/13 03/06/2013   Past Surgical History  Procedure Laterality Date  . Hernia repair    . Wisdom tooth extraction    . Cesarean section       x 3   Family History  Problem Relation Age of Onset  . Cancer Mother     Abdominal cancer  . Heart Problems Mother   . Diabetes Father   . Hypertension Father   . Asthma Sister   . Arthritis Sister   . Heart disease Mother   . Heart disease Sister   . CVA Maternal Aunt   . Diabetes Sister    History  Substance Use Topics  . Smoking status: Never Smoker   . Smokeless tobacco: Never Used  . Alcohol Use: No   OB History     Gravida Para Term Preterm AB TAB SAB Ectopic Multiple Living   4 3 2 1 1  1   3      Review of Systems  Constitutional: Negative for fever and chills.  Respiratory: Negative.  Negative for shortness of breath.   Cardiovascular: Negative.  Negative for chest pain.  Gastrointestinal: Positive for nausea, vomiting and abdominal pain. Negative for diarrhea.  Genitourinary: Negative.  Negative for dysuria.  Musculoskeletal: Negative.   Skin: Negative.   Neurological: Negative.       Allergies  Review of patient's allergies indicates no known allergies.  Home Medications   Prior to Admission medications   Medication Sig Start Date End Date Taking? Authorizing Provider  buPROPion [facility]) 100 MG tablet  08/09/14   Historical Provider, MD  CALCIUM PO Take by mouth.    Historical Provider, MD  FLUoxetine (PROZAC) 40 MG capsule Take 40 mg by mouth daily.    Historical Provider, MD  ibuprofen (ADVIL,MOTRIN) 200 MG tablet Take 800 mg by mouth every 6 (six) hours as needed for mild pain.    Historical Provider, MD  Multiple Vitamin (MULTIVITAMIN WITH MINERALS) TABS Take 1 tablet by mouth daily.    Historical Provider, MD   BP 138/81 mmHg  Pulse  88  Temp(Src) 98.4 F (36.9 C) (Oral)  Resp 16  Ht 5' (1.524 m)  Wt 188 lb (85.276 kg)  BMI 36.72 kg/m2  SpO2 95%  LMP 10/04/2013 Physical Exam  Constitutional: She is oriented to person, place, and time. She appears well-developed and well-nourished.  HENT:  Head: Normocephalic.  Neck: Normal range of motion. Neck supple.  Cardiovascular: Normal rate and regular rhythm.   Pulmonary/Chest: Effort normal and breath sounds normal.  Abdominal: Soft. Bowel sounds are normal. She exhibits no mass. There is tenderness. There is no rebound and no guarding.  Tender periumbilical abdomen. No RLQ tenderness.   Musculoskeletal: Normal range of motion.  Neurological: She is alert and oriented to person, place, and time.  Skin: Skin is warm and dry.  No rash noted.  Psychiatric: She has a normal mood and affect.    ED Course  Procedures (including critical care time) Labs Review Labs Reviewed  CBC WITH DIFFERENTIAL/PLATELET  URINALYSIS, ROUTINE W REFLEX MICROSCOPIC (NOT AT [facility])  COMPREHENSIVE METABOLIC PANEL  LIPASE, BLOOD   Results for orders placed or performed during the hospital encounter of 11/05/14  CBC with Differential  Result Value Ref Range   WBC 6.4 4.0 - 10.5 K/uL   RBC 4.67 3.87 - 5.11 MIL/uL   Hemoglobin 13.7 12.0 - 15.0 g/dL   HCT 16.141.3 09.636.0 - 04.546.0 %   MCV 88.4 78.0 - 100.0 fL   MCH 29.3 26.0 - 34.0 pg   MCHC 33.2 30.0 - 36.0 g/dL   RDW 40.913.4 81.111.5 - 91.415.5 %   Platelets 267 150 - 400 K/uL   Neutrophils Relative % 54 43 - 77 %   Neutro Abs 3.4 1.7 - 7.7 K/uL   Lymphocytes Relative 36 12 - 46 %   Lymphs Abs 2.3 0.7 - 4.0 K/uL   Monocytes Relative 6 3 - 12 %   Monocytes Absolute 0.4 0.1 - 1.0 K/uL   Eosinophils Relative 3 0 - 5 %   Eosinophils Absolute 0.2 0.0 - 0.7 K/uL   Basophils Relative 1 0 - 1 %   Basophils Absolute 0.1 0.0 - 0.1 K/uL  Comprehensive metabolic panel  Result Value Ref Range   Sodium 140 135 - 145 mmol/L   Potassium 3.4 (L) 3.5 - 5.1 mmol/L   Chloride 107 101 - 111 mmol/L   CO2 26 22 - 32 mmol/L   Glucose, Bld 90 65 - 99 mg/dL   BUN 6 6 - 20 mg/dL   Creatinine, Ser 7.820.53 0.44 - 1.00 mg/dL   Calcium 9.0 8.9 - 95.610.3 mg/dL   Total Protein 7.1 6.5 - 8.1 g/dL   Albumin 3.2 (L) 3.5 - 5.0 g/dL   AST 20 15 - 41 U/L   ALT 19 14 - 54 U/L   Alkaline Phosphatase 115 38 - 126 U/L   Total Bilirubin 0.3 0.3 - 1.2 mg/dL   GFR calc non Af Amer >60 >60 mL/min   GFR calc Af Amer >60 >60 mL/min   Anion gap 7 5 - 15  Lipase, blood  Result Value Ref Range   Lipase 27 22 - 51 U/L  Urinalysis, Routine w reflex microscopic (not at [facility])  Result Value Ref Range   Color, Urine YELLOW YELLOW   APPearance CLEAR CLEAR   Specific Gravity, Urine 1.015 1.005 - 1.030   pH 5.5 5.0 - 8.0   Glucose, UA  NEGATIVE NEGATIVE mg/dL   Hgb urine dipstick NEGATIVE NEGATIVE   Bilirubin Urine NEGATIVE NEGATIVE  Ketones, ur NEGATIVE NEGATIVE mg/dL   Protein, ur NEGATIVE NEGATIVE mg/dL   Urobilinogen, UA 1.0 0.0 - 1.0 mg/dL   Nitrite NEGATIVE NEGATIVE   Leukocytes, UA NEGATIVE NEGATIVE   Ct Abdomen Pelvis W Contrast  11/06/2014   CLINICAL DATA:  Mid abdominal pain, nausea, vomiting. Onset last evening.  EXAM: CT ABDOMEN AND PELVIS WITH CONTRAST  TECHNIQUE: Multidetector CT imaging of the abdomen and pelvis was performed using the standard protocol following bolus administration of intravenous contrast.  CONTRAST:  OMNIPAQUE IOHEXOL 300 MG/ML  SOLN  COMPARISON:  None.  FINDINGS: Lower chest:  No significant abnormality  Hepatobiliary: There are normal appearances of the liver, gallbladder and bile ducts.  Pancreas: Normal  Spleen: Normal  Adrenals/Urinary Tract: There is a 2.4 cm solid left adrenal mass, indeterminate. The right adrenal is normal. The kidneys are normal.  Stomach/Bowel: There is a small hiatal hernia. The stomach and small bowel are normal. The appendix is normal. There is mild colonic diverticulosis.  Vascular/Lymphatic: The abdominal aorta is normal in caliber. There is no atherosclerotic calcification. There is no adenopathy in the abdomen or pelvis.  Reproductive: The uterus and ovaries appear unremarkable.  Other: No acute inflammatory changes are evident in the abdomen or pelvis. There is no ascites.  Musculoskeletal: No significant abnormality.  IMPRESSION: 1. No acute findings are evident in the abdomen or pelvis. 2. Diverticulosis 3. Hiatal hernia 4. 2.4 cm left adrenal mass, indeterminate. Consider follow-up CT or MRI in 12 months. This recommendation follows ACR consensus guidelines: Managing Incidental Findings on Abdominal CT: White Paper of the ACR Incidental Findings Committee. Earlyne Iba Radiol [phone]   Electronically Signed   By: Ellery Plunk M.D.   On: 11/06/2014  02:31     Imaging Review No results found.   EKG Interpretation None      MDM   Final diagnoses:  None    1. Abdominal pain 2. Nausea and vomiting  She is feeling improved with medications. Lab studies, CT scan unremarkable for cause of symptoms. She is stable for discharge home and should see PCP for further outpatient evaluation if symptoms persist.     Elpidio Anis, PA-C 11/06/14 0559  Purvis Sheffield, MD 11/06/14 [phone]

## 2014-11-06 ENCOUNTER — Encounter (HOSPITAL_COMMUNITY): Payer: Self-pay | Admitting: Radiology

## 2014-11-06 ENCOUNTER — Emergency Department (HOSPITAL_COMMUNITY): Payer: 59

## 2014-11-06 MED ORDER — IOHEXOL 300 MG/ML  SOLN
100.0000 mL | Freq: Once | INTRAMUSCULAR | Status: AC | PRN
  Administered 2014-11-06: 100 mL via INTRAVENOUS

## 2014-11-06 MED ORDER — DICYCLOMINE HCL 20 MG PO TABS: 20.0000 mg | ORAL_TABLET | Freq: Two times a day (BID) | ORAL | Status: DC

## 2014-11-06 MED ORDER — ONDANSETRON HCL 4 MG PO TABS: 4.0000 mg | ORAL_TABLET | Freq: Four times a day (QID) | ORAL | Status: DC

## 2014-11-06 MED ORDER — IOHEXOL 300 MG/ML  SOLN
25.0000 mL | Freq: Once | INTRAMUSCULAR | Status: AC | PRN
  Administered 2014-11-06: 25 mL via ORAL

## 2014-11-06 NOTE — Discharge Instructions (Signed)

## 2014-11-11 ENCOUNTER — Encounter: Payer: Self-pay | Admitting: Family Medicine

## 2014-11-11 ENCOUNTER — Ambulatory Visit (INDEPENDENT_AMBULATORY_CARE_PROVIDER_SITE_OTHER): Payer: Self-pay | Admitting: Family Medicine

## 2014-11-11 DIAGNOSIS — R69 Illness, unspecified: Secondary | ICD-10-CM

## 2014-11-11 NOTE — Progress Notes (Signed)
NO SHOW

## 2015-02-07 IMAGING — CT CT ANGIO CHEST
2 of 8 series · 19 of 46 positions shown · IV contrast (APPLIED)
Comparison: None

CLINICAL DATA: Intermittent palpitations, heart pounding, weakness,
body aches, chills, nausea, lightheadedness, elevated D-dimer
history of DVT x 2, diabetes

EXAM:
CT ANGIOGRAPHY CHEST WITH CONTRAST
TECHNIQUE: Multidetector CT imaging of the chest was performed using the
standard protocol during bolus administration of intravenous
contrast. Multiplanar CT image reconstructions including MIPs were
obtained to evaluate the vascular anatomy.
CONTRAST:  80mL OMNIPAQUE IOHEXOL 350 MG/ML SOLN

[Series 5: thins · axial · 0.52mm/px · z∈[+1291,+1490]mm · 16 of 219 slices shown]
[im 10/219  lung]
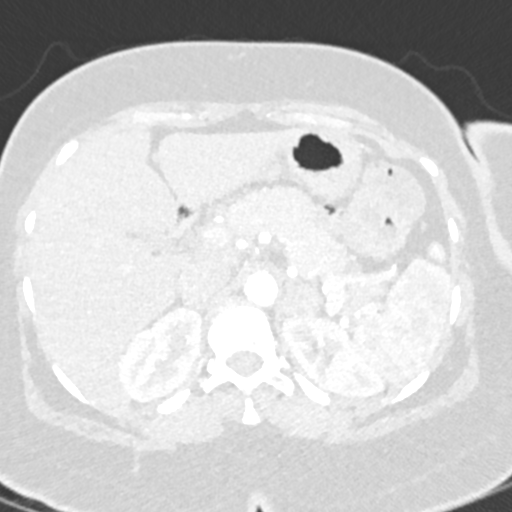
[im 20/219  soft-tissue]
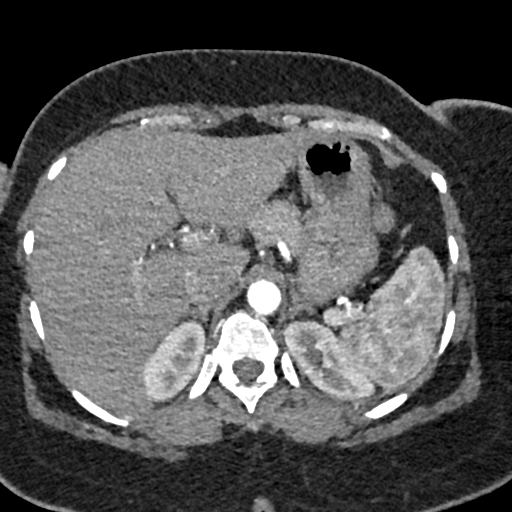
[im 40/219  lung]
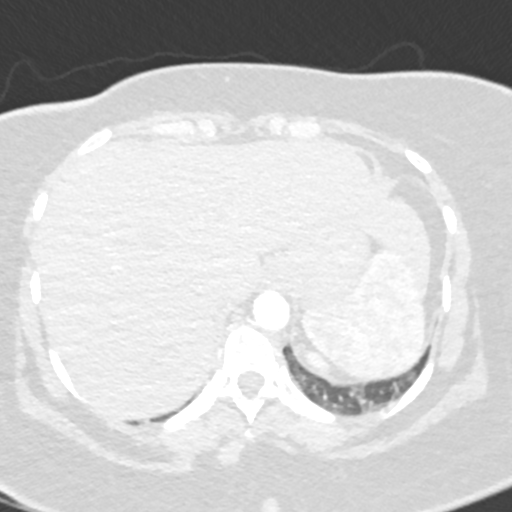
[im 50/219  soft-tissue]
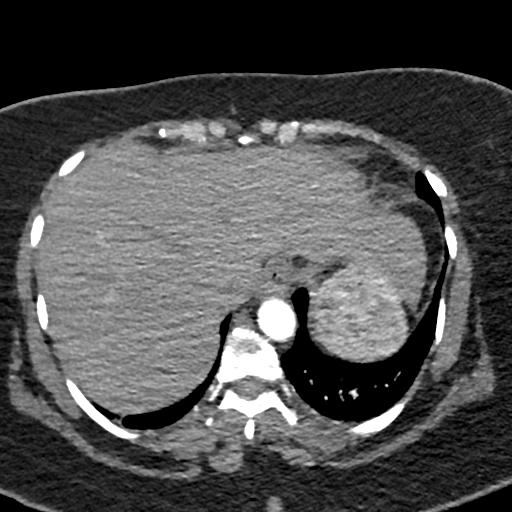
[im 60/219  lung]
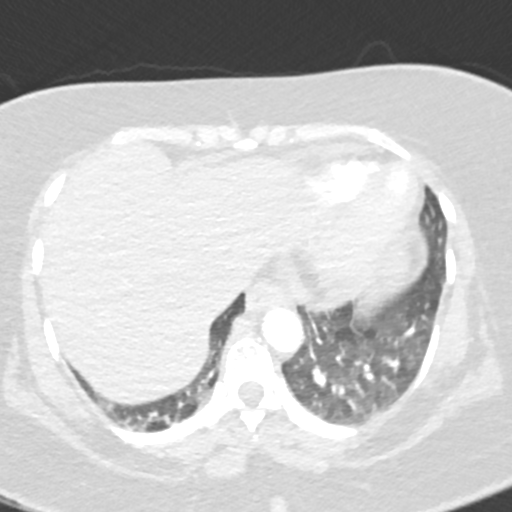
[im 80/219  soft-tissue]
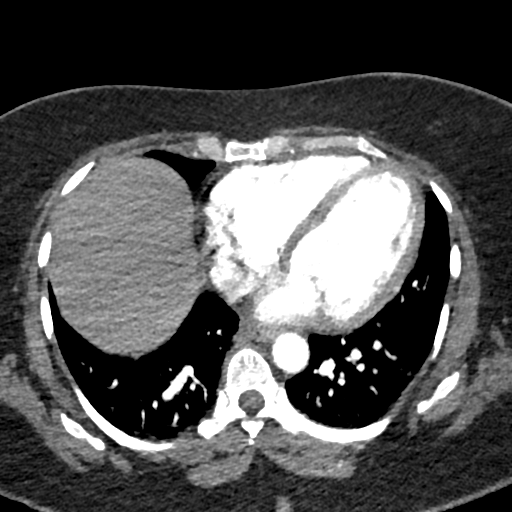
[im 90/219  lung]
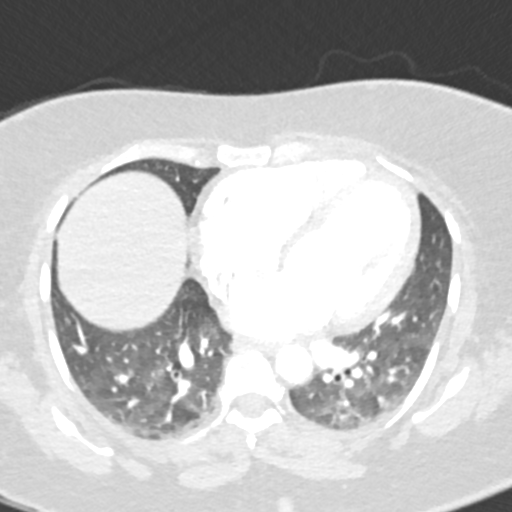
[im 100/219  soft-tissue]
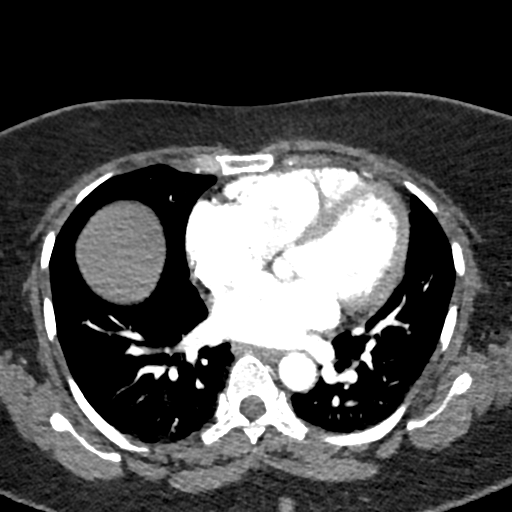
[im 119/219  lung]
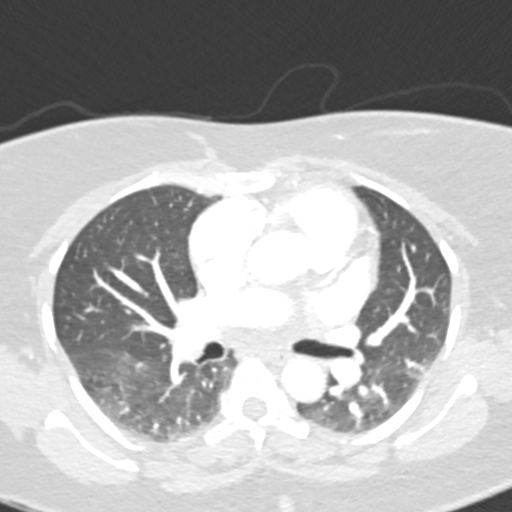
[im 129/219  soft-tissue]
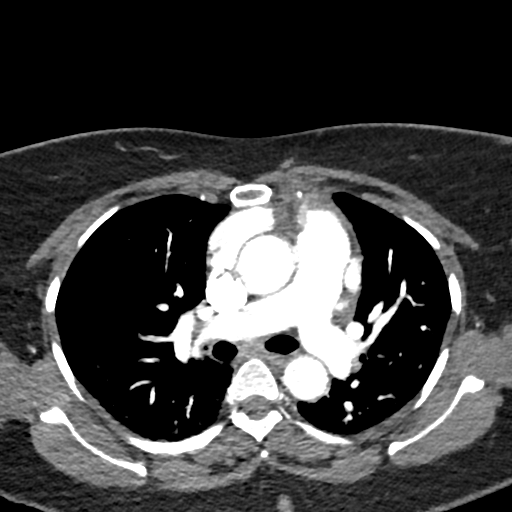
[im 139/219  lung]
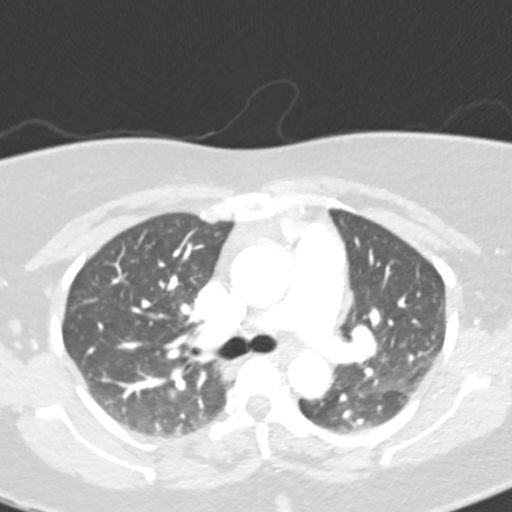
[im 159/219  soft-tissue]
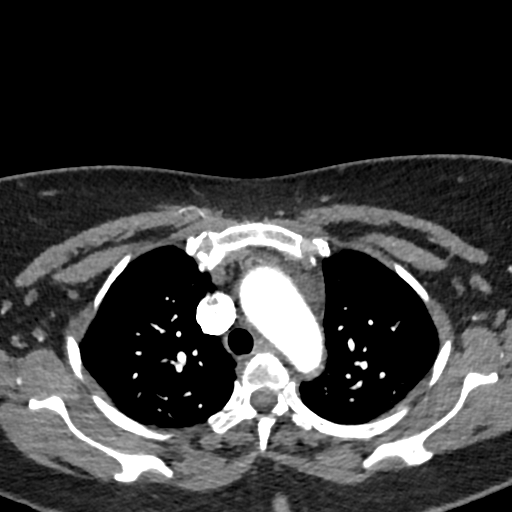
[im 169/219  lung]
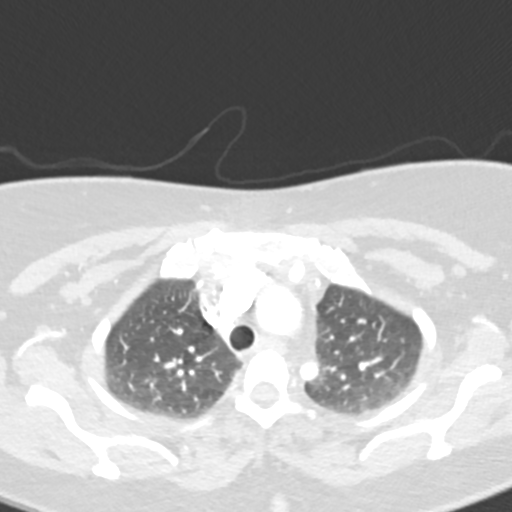
[im 179/219  soft-tissue]
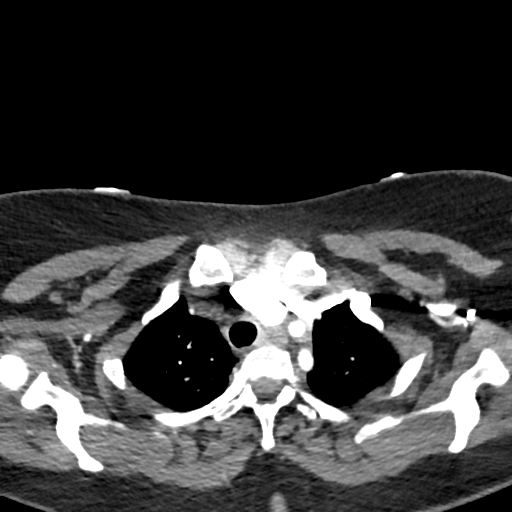
[im 199/219  lung]
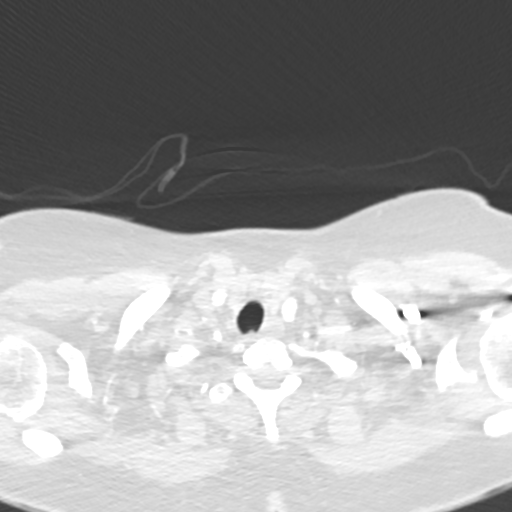
[im 209/219  soft-tissue]
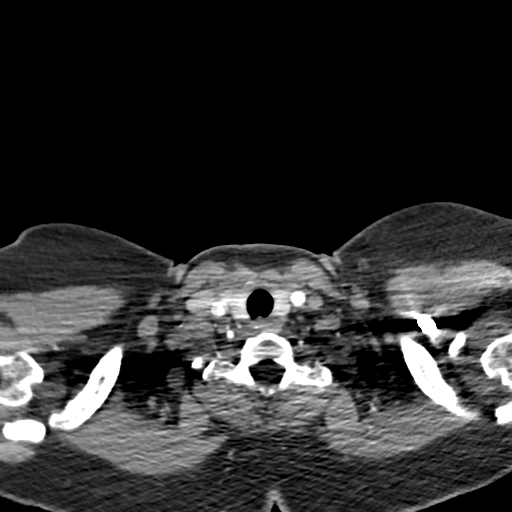

[Series 7: coronal mpr · coronal · 0.59mm/px · 3 of 92 slices shown]
[im 23/92  soft-tissue]
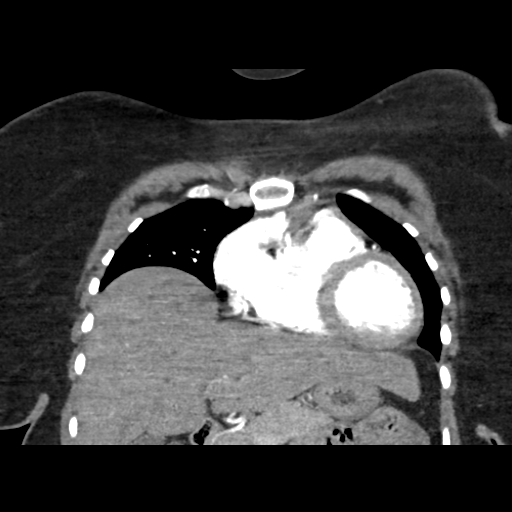
[im 46/92  soft-tissue]
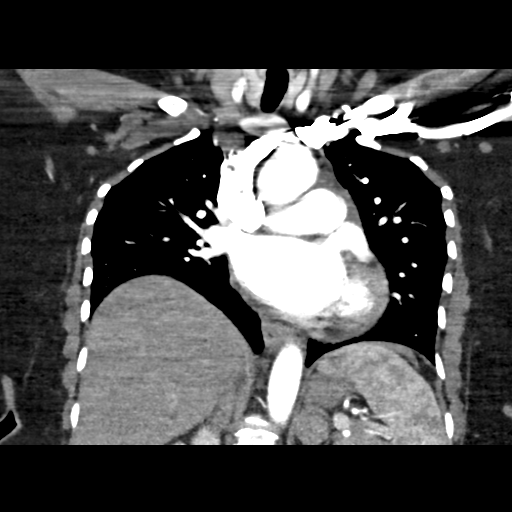
[im 69/92  soft-tissue]
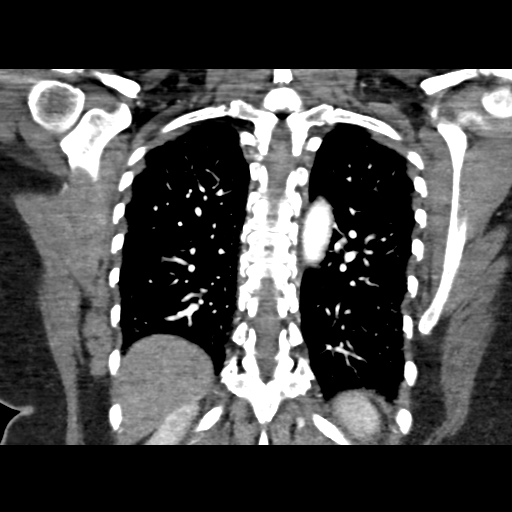

[19 of 46 positions shown; findings below may reference images not displayed]

FINDINGS: Aorta normal caliber without aneurysm or dissection.

Visualized portion of upper abdomen normal appearance.

Pulmonary arteries patent.

No evidence pulmonary embolism.

Central pulmonary arteries upper normal size.

Dependent atelectasis in the posterior lungs.

No definite pulmonary infiltrate, pleural effusion or pneumothorax.

No acute osseous findings.

Review of the MIP images confirms the above findings.
IMPRESSION: No evidence of pulmonary embolism.

Minimal dependent atelectasis in both lungs.

## 2015-02-27 ENCOUNTER — Inpatient Hospital Stay (HOSPITAL_COMMUNITY)
Admission: AD | Admit: 2015-02-27 | Discharge: 2015-02-27 | Disposition: A | Discharge status: Home / Self Care | Payer: 59 | Source: Ambulatory Visit | Attending: Obstetrics & Gynecology | Admitting: Obstetrics & Gynecology

## 2015-02-27 ENCOUNTER — Encounter (HOSPITAL_COMMUNITY): Payer: Self-pay | Admitting: *Deleted

## 2015-02-27 DIAGNOSIS — R109 Unspecified abdominal pain: Secondary | ICD-10-CM | POA: Insufficient documentation

## 2015-02-27 DIAGNOSIS — N939 Abnormal uterine and vaginal bleeding, unspecified: Secondary | ICD-10-CM | POA: Diagnosis not present

## 2015-02-27 LAB — CBC
HEMATOCRIT: 41.8 % (ref 36.0–46.0)
Hemoglobin: 14 g/dL (ref 12.0–15.0)
MCH: 30 pg (ref 26.0–34.0)
MCHC: 33.5 g/dL (ref 30.0–36.0)
MCV: 89.7 fL (ref 78.0–100.0)
Platelets: 275 10*3/uL (ref 150–400)
RBC: 4.66 MIL/uL (ref 3.87–5.11)
RDW: 13.9 % (ref 11.5–15.5)
WBC: 5.1 10*3/uL (ref 4.0–10.5)

## 2015-02-27 LAB — URINALYSIS, ROUTINE W REFLEX MICROSCOPIC
Bilirubin Urine: NEGATIVE
Glucose, UA: NEGATIVE mg/dL
Ketones, ur: NEGATIVE mg/dL
NITRITE: NEGATIVE
PROTEIN: 30 mg/dL — AB
Specific Gravity, Urine: 1.015 (ref 1.005–1.030)
Urobilinogen, UA: 1 mg/dL (ref 0.0–1.0)
pH: 9 — ABNORMAL HIGH (ref 5.0–8.0)

## 2015-02-27 LAB — URINE MICROSCOPIC-ADD ON

## 2015-02-27 LAB — POCT PREGNANCY, URINE: PREG TEST UR: NEGATIVE

## 2015-02-27 MED ORDER — KETOROLAC TROMETHAMINE 60 MG/2ML IM SOLN
60.0000 mg | Freq: Once | INTRAMUSCULAR | Status: AC
  Administered 2015-02-27: 60 mg via INTRAMUSCULAR
  Filled 2015-02-27: qty 2

## 2015-02-27 MED ORDER — NORETHINDRONE ACETATE 5 MG PO TABS: 5.0000 mg | ORAL_TABLET | Freq: Two times a day (BID) | ORAL | Status: DC

## 2015-02-27 MED ORDER — OXYCODONE-ACETAMINOPHEN 5-325 MG PO TABS: 1.0000 | ORAL_TABLET | ORAL | Status: DC | PRN

## 2015-02-27 NOTE — MAU Note (Signed)
No period for 2 years and this Saturday, pt started having medium flow of  vaginal bleeding; c/o also of abdominal cramping;

## 2015-02-27 NOTE — MAU Note (Signed)
UPT in MAU is negative today;

## 2015-02-27 NOTE — Discharge Instructions (Signed)
Abnormal Uterine Bleeding Abnormal uterine bleeding can affect women at various stages in life, including teenagers, women in their reproductive years, pregnant women, and women who have reached menopause. Several kinds of uterine bleeding are considered abnormal, including:  Bleeding or spotting between periods.   Bleeding after sexual intercourse.   Bleeding that is heavier or more than normal.   Periods that last longer than usual.  Bleeding after menopause.  Many cases of abnormal uterine bleeding are minor and simple to treat, while others are more serious. Any type of abnormal bleeding should be evaluated by your health care provider. Treatment will depend on the cause of the bleeding. HOME CARE INSTRUCTIONS Monitor your condition for any changes. The following actions may help to alleviate any discomfort you are experiencing:  Avoid the use of tampons and douches as directed by your health care provider.  Change your pads frequently. You should get regular pelvic exams and Pap tests. Keep all follow-up appointments for diagnostic tests as directed by your health care provider.  SEEK MEDICAL CARE IF:   Your bleeding lasts more than 1 week.   You feel dizzy at times.  SEEK IMMEDIATE MEDICAL CARE IF:   You pass out.   You are changing pads every 15 to 30 minutes.   You have abdominal pain.  You have a fever.   You become sweaty or weak.   You are passing large blood clots from the vagina.   You start to feel nauseous and vomit. MAKE SURE YOU:   Understand these instructions.  Will watch your condition.  Will get help right away if you are not doing well or get worse.   This information is not intended to replace advice given to you by your health care provider. Make sure you discuss any questions you have with your health care provider.   Document Released: 04/22/2005 Document Revised: 04/27/2013 Document Reviewed: 11/19/2012 Elsevier Interactive  Patient Education 2016 [employer]. Endometrial Biopsy Endometrial biopsy is a procedure in which a tissue sample is taken from inside the uterus. The tissue sample is then looked at under a microscope to see if the tissue is normal or abnormal. The endometrium is the lining of the uterus. This procedure helps determine where you are in your menstrual cycle and how hormone levels are affecting the lining of the uterus. This procedure may also be used to evaluate uterine bleeding or to diagnose endometrial cancer, tuberculosis, polyps, or inflammatory conditions.  LET YOUR HEALTH CARE PROVIDER KNOW ABOUT:  Any allergies you have.  All medicines you are taking, including vitamins, herbs, eye drops, creams, and over-the-counter medicines.  Previous problems you or members of your family have had with the use of anesthetics.  Any blood disorders you have.  Previous surgeries you have had.  Medical conditions you have.  Possibility of pregnancy. RISKS AND COMPLICATIONS Generally, this is a safe procedure. However, as with any procedure, complications can occur. Possible complications include:  Bleeding.  Pelvic infection.  Puncture of the uterine wall with the biopsy device (rare). BEFORE THE PROCEDURE   Keep a record of your menstrual cycles as directed by your health care provider. You may need to schedule your procedure for a specific time in your cycle.  You may want to bring a sanitary pad to wear home after the procedure.  Arrange for someone to drive you home after the procedure if you will be given a medicine to help you relax (sedative). PROCEDURE   You may be   given a sedative to relax you.  You will lie on an exam table with your feet and legs supported as in a pelvic exam.  Your health care provider will insert an instrument (speculum) into your vagina to see your cervix.  Your cervix will be cleansed with an antiseptic solution. A medicine (local anesthetic) will be  used to numb the cervix.  A forceps instrument (tenaculum) will be used to hold your cervix steady for the biopsy.  A thin, rodlike instrument (uterine sound) will be inserted through your cervix to determine the length of your uterus and the location where the biopsy sample will be removed.  A thin, flexible tube (catheter) will be inserted through your cervix and into the uterus. The catheter is used to collect the biopsy sample from your endometrial tissue.  The catheter and speculum will then be removed, and the tissue sample will be sent to a lab for examination. AFTER THE PROCEDURE  You will rest in a recovery area until you are ready to go home.  You may have mild cramping and a small amount of vaginal bleeding for a few days after the procedure. This is normal.  Make sure you find out how to get your test results.   This information is not intended to replace advice given to you by your health care provider. Make sure you discuss any questions you have with your health care provider.   Document Released: 08/23/2004 Document Revised: 12/23/2012 Document Reviewed: 10/07/2012 Elsevier Interactive Patient Education 2016 Elsevier Inc.  

## 2015-02-27 NOTE — MAU Provider Note (Signed)
History     CSN: [id number]  Arrival date and time: 02/27/15 [id number]  Provider notified: 1023 Provider on unit: 1030 Provider at bedside: 1045     Chief Complaint  Patient presents with  . Vaginal Bleeding   HPI  [name] is a 44 yo [phone] female presenting this morning with complaints of vaginal bleeding and cramps since Saturday.  She reports not having any menses in >2 yrs. Normal sono in 11/2013. Her primary W[facility] provider is Dr. Seymour BarsLavoie.  Past Medical History  Diagnosis Date  . Anxiety and depression   . Vertigo     chronic, s/p eval with cardiology, neurology and ENT  . H/O varicella   . Blood transfusion   . H/O blood clots     DVT with pregnancy  . H/O bladder infections   . Leaking of urine   . Preterm labor   . H/O dysmenorrhea 05/08/11  . Adenomyosis 05/08/11  . Pelvic pain 1/2/`13  . Heart murmur   . Colon polyps   . Elevated d-dimer- 1.29 on Oct 27th 2014- CTA neg for PE, LE venous dopplers neg for DVT 03/10/13 03/06/2013    Past Surgical History  Procedure Laterality Date  . Hernia repair    . Wisdom tooth extraction    . Cesarean section       x 3    Family History  Problem Relation Age of Onset  . Cancer Mother     Abdominal cancer  . Heart Problems Mother   . Diabetes Father   . Hypertension Father   . Asthma Sister   . Arthritis Sister   . Heart disease Mother   . Heart disease Sister   . CVA Maternal Aunt   . Diabetes Sister     Social History  Substance Use Topics  . Smoking status: Never Smoker   . Smokeless tobacco: Never Used  . Alcohol Use: No    Allergies: No Known Allergies  Prescriptions prior to admission  Medication Sig Dispense Refill Last Dose  . buPROPion (WELLBUTRIN XL) 300 MG 24 hr tablet Take 300 mg by mouth daily.   02/26/2015 at Unknown time  . FLUoxetine (PROZAC) 40 MG capsule Take 40 mg by mouth daily.   02/26/2015 at Unknown time  . ibuprofen (ADVIL,MOTRIN) 200 MG tablet Take 800 mg by mouth every 6  (six) hours as needed.   Past Week at Unknown time  . LORazepam (ATIVAN) 0.5 MG tablet Take 0.5 mg by mouth every 8 (eight) hours as needed for anxiety.   02/26/2015 at Unknown time  . dicyclomine (BENTYL) 20 MG tablet Take 1 tablet (20 mg total) by mouth 2 (two) times daily. (Patient not taking: Reported on 02/27/2015) 20 tablet 0   . ondansetron (ZOFRAN) 4 MG tablet Take 1 tablet (4 mg total) by mouth every 6 (six) hours. (Patient not taking: Reported on 02/27/2015) 12 tablet 0     Review of Systems  Constitutional: Negative.   HENT: Negative.   Eyes: Negative.   Respiratory: Negative.   Cardiovascular: Negative.   Gastrointestinal: Positive for abdominal pain.  Genitourinary:       Cramping  Musculoskeletal: Negative.   Skin: Negative.   Neurological: Negative.   Endo/Heme/Allergies: Negative.   Psychiatric/Behavioral: Negative.    Physical Exam   Blood pressure 126/80, pulse 84, temperature 98.5 F (36.9 C), temperature source Oral, resp. rate 18, height 5' (1.524 m), weight 83.008 kg (183 lb), last menstrual period 10/04/2013.  Physical Exam  Constitutional: She is oriented to person, place, and time. She appears well-developed and well-nourished.  HENT:  Head: Normocephalic and atraumatic.  Eyes: Pupils are equal, round, and reactive to light.  Neck: Normal range of motion.  Cardiovascular: Normal rate, regular rhythm, normal heart sounds and intact distal pulses.   Respiratory: Effort normal and breath sounds normal.  GI: Soft. Bowel sounds are normal.  Genitourinary:  Small amount of dark red blood in vaginal vault; normal uterus and adnexa via bimanual exam; some grimacing with bimanual exam  Musculoskeletal: Normal range of motion.  Neurological: She is alert and oriented to person, place, and time. She has normal reflexes.  Skin: Skin is warm and dry.  Psychiatric: She has a normal mood and affect. Her behavior is normal. Judgment and thought content normal.     MAU Course  Procedures 1) Speculum exam  Assessment and Plan  Abnormal Uterine Bleeding Cramping  Discharge Home Rx: Aygestin 5 mg BID x 10 days F/U with Dr. Seymour Bars for EBx and lab work after bleeding stops Call the office before coming to hospital for any further problems  *Dr. Billy Coast notified of assessment and plan / recommend Aygestin 5 mg tx and f/u plan  Raelyn Mora, M MSN, CNM 02/27/2015, 10:54 AM

## 2015-03-02 NOTE — Telephone Encounter (Signed)
Error

## 2015-04-25 ENCOUNTER — Other Ambulatory Visit: Payer: Self-pay | Admitting: Obstetrics & Gynecology

## 2015-04-25 DIAGNOSIS — R928 Other abnormal and inconclusive findings on diagnostic imaging of breast: Secondary | ICD-10-CM

## 2015-05-04 ENCOUNTER — Other Ambulatory Visit: Payer: 59

## 2015-05-04 ENCOUNTER — Inpatient Hospital Stay: Admission: RE | Admit: 2015-05-04 | Payer: 59 | Source: Ambulatory Visit

## 2015-05-13 IMAGING — CT CT HEAD W/O CM
2 series · 16 of 30 positions shown, 18 images · non-contrast
Comparison: Brain MRI, 04/12/2013.

CLINICAL DATA: Dizziness for 3 months. Patient had dozing off
episodes at the ENT office visit today. New onset cough and chest
pressure.

EXAM:
CT HEAD WITHOUT CONTRAST
TECHNIQUE: Contiguous axial images were obtained from the base of the skull
through the vertex without intravenous contrast.

[Series 2: head w/o · axial · non-contrast · 0.49mm/px · z∈[+107,+217]mm · 8 of 30 slices shown, 10 images]
[im 4/30  brain]
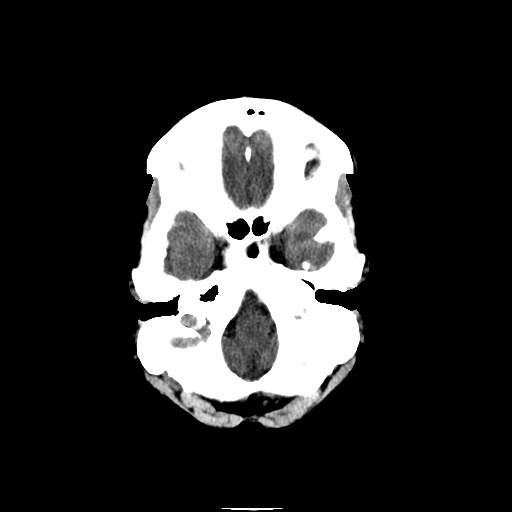
[im 4/30  bone]
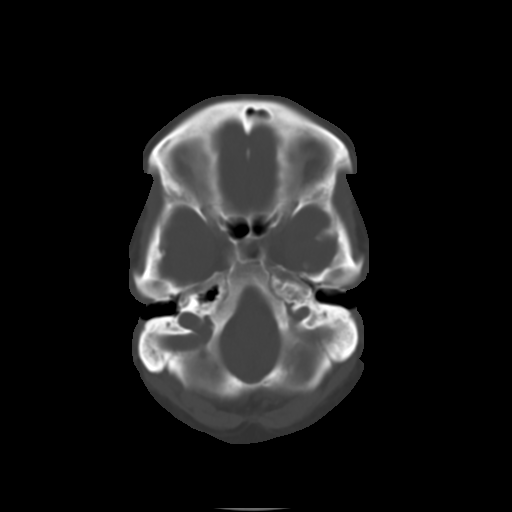
[im 7/30  brain]
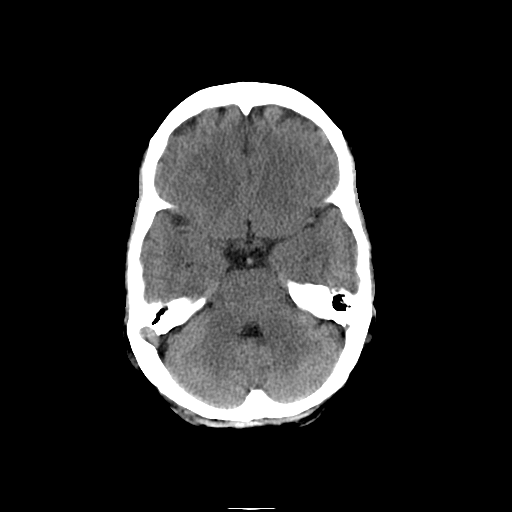
[im 10/30  brain]
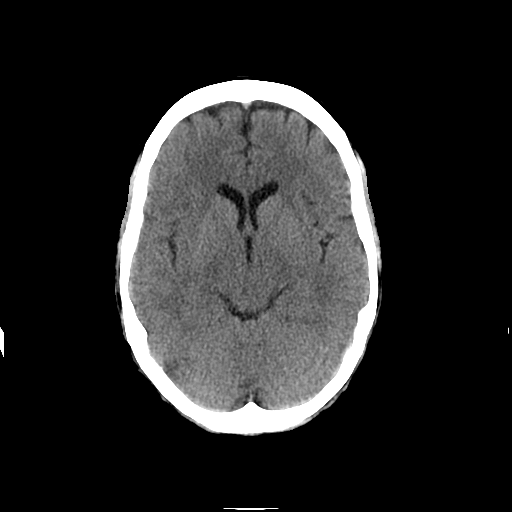
[im 13/30  brain]
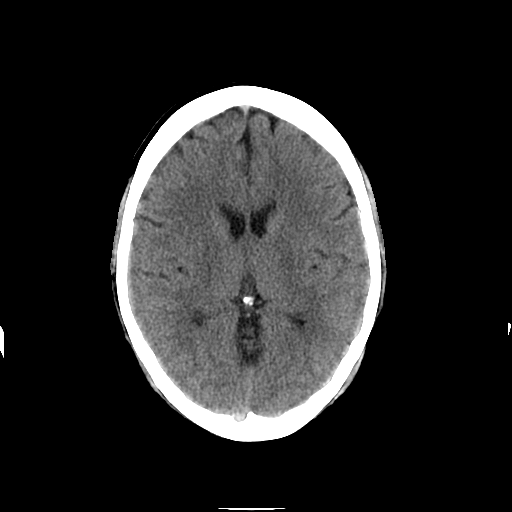
[im 17/30  brain]
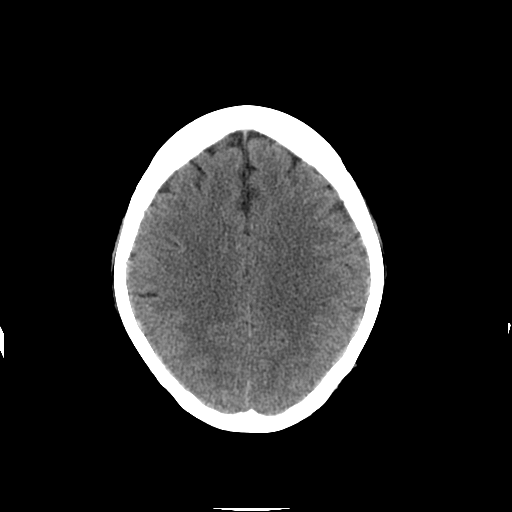
[im 17/30  bone]
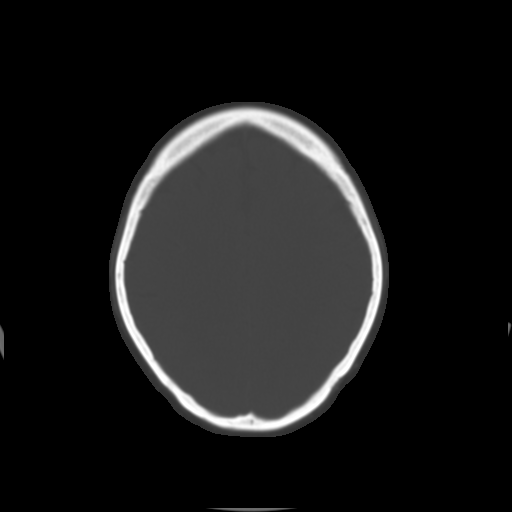
[im 20/30  brain]
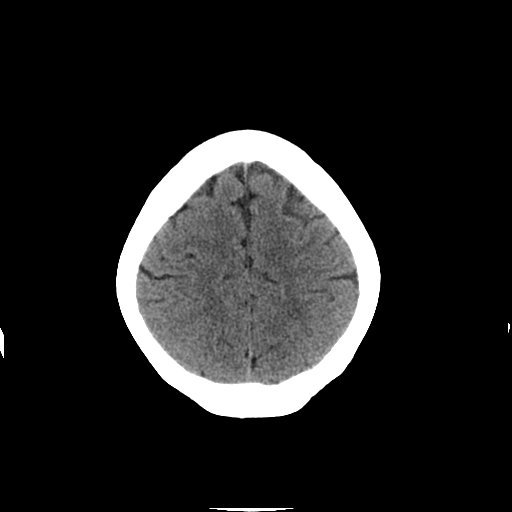
[im 23/30  brain]
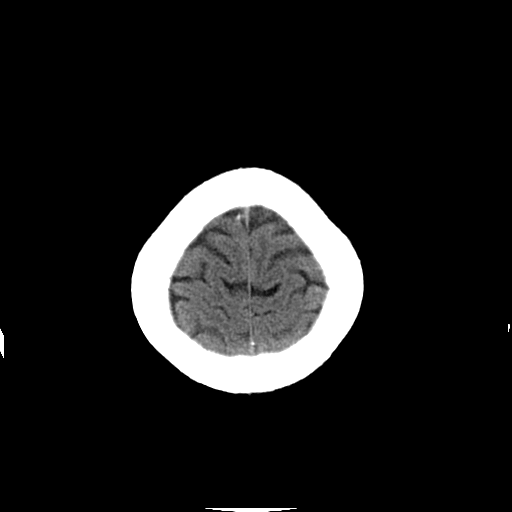
[im 26/30  brain]
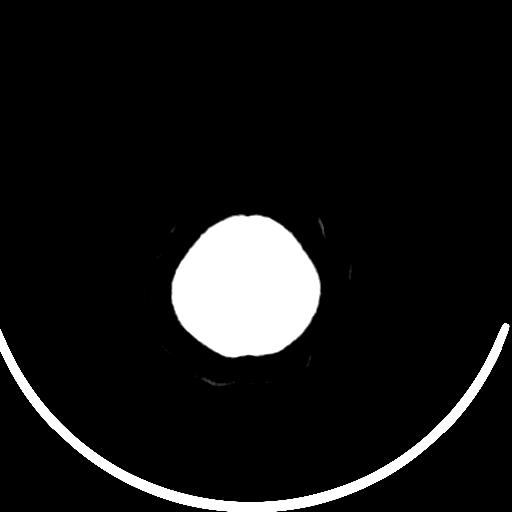

[Series 3: head w/o bone · axial · non-contrast · 0.49mm/px · z∈[+107,+219]mm · 8 of 59 slices shown]
[im 7/59  bone]
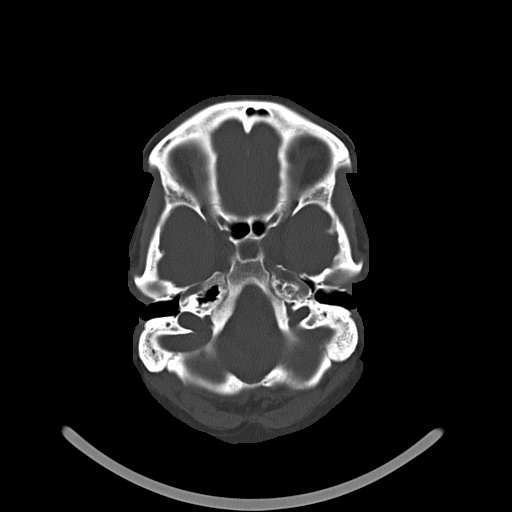
[im 13/59  bone]
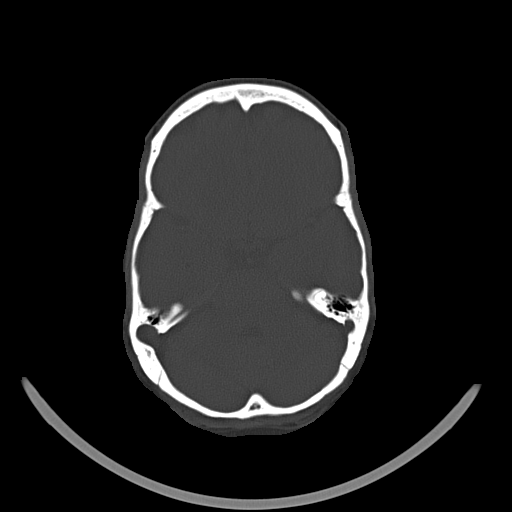
[im 19/59  bone]
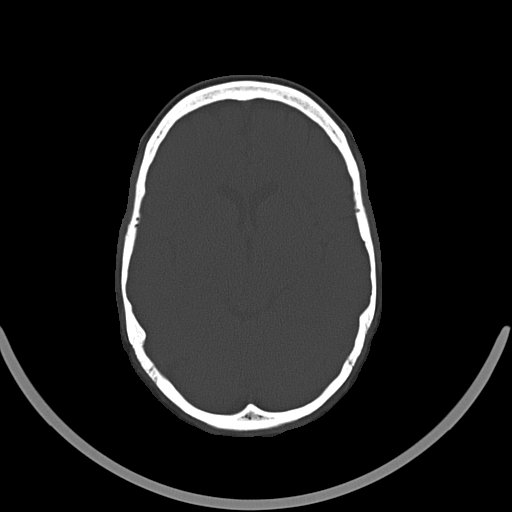
[im 25/59  bone]
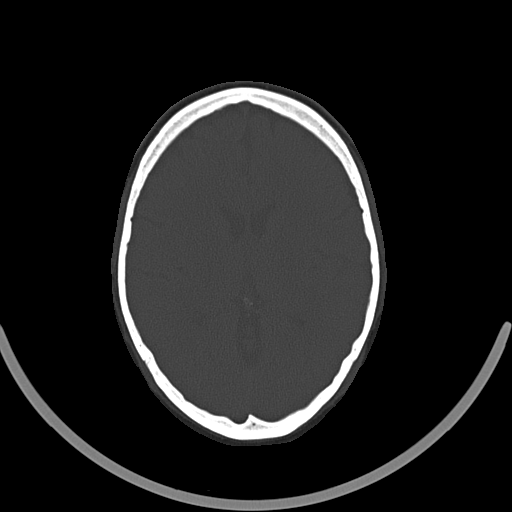
[im 34/59  bone]
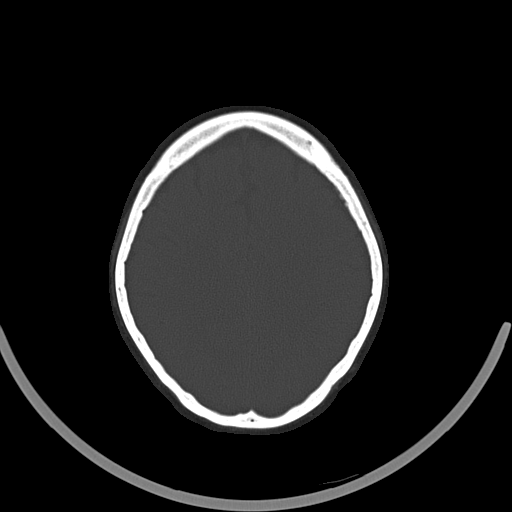
[im 40/59  bone]
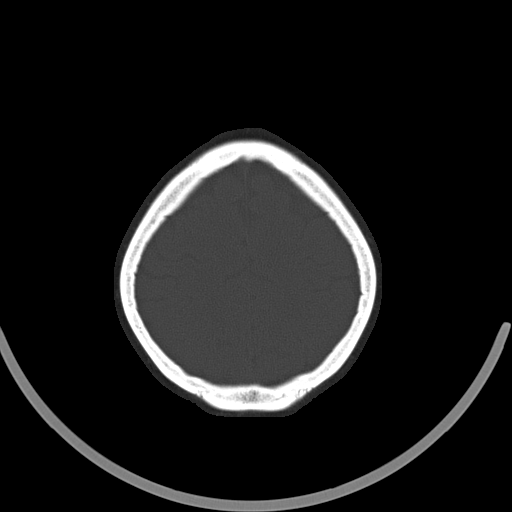
[im 46/59  bone]
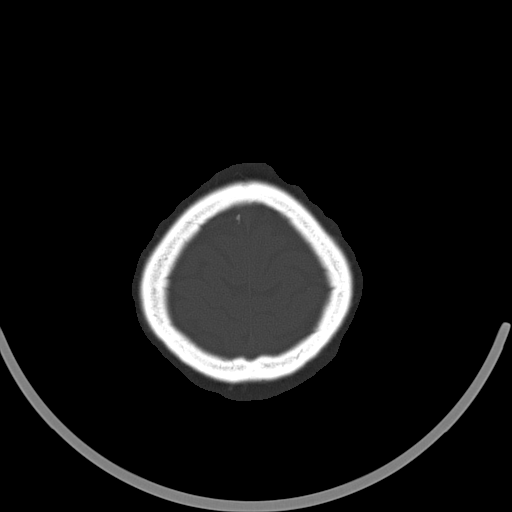
[im 52/59  bone]
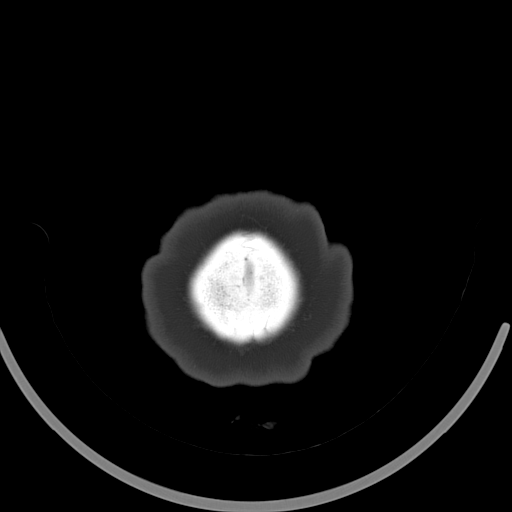

[16 of 30 positions shown; findings below may reference images not displayed]

FINDINGS: Ventricles are normal in size and configuration. There are no
parenchymal masses or mass effect. There are no areas of abnormal
parenchymal attenuation there is no evidence of a cortical infarct.

There are no extra-axial masses or abnormal fluid collections.

There is no intracranial hemorrhage

Visualized sinuses, mastoid air cells and middle ear cavities are
clear.

There is irregularity along the left petrous apex adjacent to the
horizontal portion of the left internal carotid canal. This may be
due to chronic fluid in the petrous apex air cells. However, given
the symptoms and lack of other findings, this could reflect a more
aggressive inflammatory process, and a follow-up high-resolution
unenhanced temporal bone CT may be helpful.
IMPRESSION: No intracranial abnormality.

Opacification of left petrous apex air cells with the suggestion of
bony irregularity. Consider a follow-up temporal bone CT as detailed
above.

## 2015-06-02 ENCOUNTER — Other Ambulatory Visit: Payer: Self-pay

## 2015-06-19 ENCOUNTER — Other Ambulatory Visit: Payer: Self-pay

## 2015-08-01 ENCOUNTER — Ambulatory Visit: Payer: Self-pay

## 2015-08-01 ENCOUNTER — Ambulatory Visit
Admission: RE | Admit: 2015-08-01 | Discharge: 2015-08-01 | Disposition: A | Discharge status: Home / Self Care | Payer: Medicaid Other | Source: Ambulatory Visit | Attending: Obstetrics & Gynecology | Admitting: Obstetrics & Gynecology

## 2015-08-01 DIAGNOSIS — R928 Other abnormal and inconclusive findings on diagnostic imaging of breast: Secondary | ICD-10-CM

## 2015-08-10 ENCOUNTER — Ambulatory Visit
Admission: RE | Admit: 2015-08-10 | Discharge: 2015-08-10 | Disposition: A | Discharge status: Home / Self Care | Payer: Medicaid Other | Source: Ambulatory Visit | Attending: Obstetrics & Gynecology | Admitting: Obstetrics & Gynecology

## 2015-08-10 ENCOUNTER — Other Ambulatory Visit: Payer: Self-pay | Admitting: Obstetrics & Gynecology

## 2015-08-10 DIAGNOSIS — N6310 Unspecified lump in the right breast, unspecified quadrant: Secondary | ICD-10-CM

## 2015-08-10 DIAGNOSIS — R928 Other abnormal and inconclusive findings on diagnostic imaging of breast: Secondary | ICD-10-CM

## 2015-08-11 ENCOUNTER — Other Ambulatory Visit: Payer: Self-pay | Admitting: Obstetrics & Gynecology

## 2015-08-11 DIAGNOSIS — N6310 Unspecified lump in the right breast, unspecified quadrant: Secondary | ICD-10-CM

## 2015-08-16 ENCOUNTER — Ambulatory Visit
Admission: RE | Admit: 2015-08-16 | Discharge: 2015-08-16 | Disposition: A | Discharge status: Home / Self Care | Payer: Medicaid Other | Source: Ambulatory Visit | Attending: Obstetrics & Gynecology | Admitting: Obstetrics & Gynecology

## 2015-08-16 ENCOUNTER — Other Ambulatory Visit: Payer: Self-pay | Admitting: Obstetrics & Gynecology

## 2015-08-16 DIAGNOSIS — N6310 Unspecified lump in the right breast, unspecified quadrant: Secondary | ICD-10-CM

## 2015-08-24 ENCOUNTER — Emergency Department (HOSPITAL_COMMUNITY)
Admission: EM | Admit: 2015-08-24 | Discharge: 2015-08-25 | Disposition: A | Discharge status: Other Acute Inpatient Hospital | Payer: Medicaid Other | Attending: Emergency Medicine | Admitting: Emergency Medicine

## 2015-08-24 ENCOUNTER — Encounter (HOSPITAL_COMMUNITY): Payer: Self-pay

## 2015-08-24 DIAGNOSIS — F419 Anxiety disorder, unspecified: Secondary | ICD-10-CM | POA: Diagnosis not present

## 2015-08-24 DIAGNOSIS — Z79899 Other long term (current) drug therapy: Secondary | ICD-10-CM | POA: Insufficient documentation

## 2015-08-24 DIAGNOSIS — Z86010 Personal history of colonic polyps: Secondary | ICD-10-CM | POA: Insufficient documentation

## 2015-08-24 DIAGNOSIS — R011 Cardiac murmur, unspecified: Secondary | ICD-10-CM | POA: Insufficient documentation

## 2015-08-24 DIAGNOSIS — Z8742 Personal history of other diseases of the female genital tract: Secondary | ICD-10-CM | POA: Insufficient documentation

## 2015-08-24 DIAGNOSIS — F322 Major depressive disorder, single episode, severe without psychotic features: Secondary | ICD-10-CM | POA: Diagnosis present

## 2015-08-24 DIAGNOSIS — F4321 Adjustment disorder with depressed mood: Secondary | ICD-10-CM | POA: Insufficient documentation

## 2015-08-24 DIAGNOSIS — Z8619 Personal history of other infectious and parasitic diseases: Secondary | ICD-10-CM | POA: Diagnosis not present

## 2015-08-24 DIAGNOSIS — F32A Depression, unspecified: Secondary | ICD-10-CM

## 2015-08-24 DIAGNOSIS — Z87448 Personal history of other diseases of urinary system: Secondary | ICD-10-CM | POA: Insufficient documentation

## 2015-08-24 DIAGNOSIS — Z8751 Personal history of pre-term labor: Secondary | ICD-10-CM | POA: Insufficient documentation

## 2015-08-24 DIAGNOSIS — F411 Generalized anxiety disorder: Secondary | ICD-10-CM | POA: Diagnosis present

## 2015-08-24 DIAGNOSIS — Z008 Encounter for other general examination: Secondary | ICD-10-CM | POA: Diagnosis present

## 2015-08-24 DIAGNOSIS — F329 Major depressive disorder, single episode, unspecified: Secondary | ICD-10-CM

## 2015-08-24 DIAGNOSIS — R45851 Suicidal ideations: Secondary | ICD-10-CM | POA: Diagnosis not present

## 2015-08-24 LAB — CBC
HCT: 42.5 % (ref 36.0–46.0)
HEMOGLOBIN: 14.7 g/dL (ref 12.0–15.0)
MCH: 29.6 pg (ref 26.0–34.0)
MCHC: 34.6 g/dL (ref 30.0–36.0)
MCV: 85.5 fL (ref 78.0–100.0)
Platelets: 274 10*3/uL (ref 150–400)
RBC: 4.97 MIL/uL (ref 3.87–5.11)
RDW: 13.4 % (ref 11.5–15.5)
WBC: 5.7 10*3/uL (ref 4.0–10.5)

## 2015-08-24 LAB — RAPID URINE DRUG SCREEN, HOSP PERFORMED
Amphetamines: NOT DETECTED
BENZODIAZEPINES: NOT DETECTED
Barbiturates: NOT DETECTED
COCAINE: NOT DETECTED
OPIATES: NOT DETECTED
TETRAHYDROCANNABINOL: NOT DETECTED

## 2015-08-24 LAB — SALICYLATE LEVEL

## 2015-08-24 LAB — COMPREHENSIVE METABOLIC PANEL
ALBUMIN: 4 g/dL (ref 3.5–5.0)
ALK PHOS: 121 U/L (ref 38–126)
ALT: 26 U/L (ref 14–54)
ANION GAP: 11 (ref 5–15)
AST: 29 U/L (ref 15–41)
BILIRUBIN TOTAL: 0.4 mg/dL (ref 0.3–1.2)
BUN: 13 mg/dL (ref 6–20)
CALCIUM: 9.6 mg/dL (ref 8.9–10.3)
CO2: 24 mmol/L (ref 22–32)
CREATININE: 0.47 mg/dL (ref 0.44–1.00)
Chloride: 103 mmol/L (ref 101–111)
GFR calc Af Amer: >60 mL/min (ref >60)
GFR calc non Af Amer: >60 mL/min (ref >60)
GLUCOSE: 84 mg/dL (ref 65–99)
Potassium: 3.8 mmol/L (ref 3.5–5.1)
Sodium: 138 mmol/L (ref 135–145)
TOTAL PROTEIN: 8.1 g/dL (ref 6.5–8.1)

## 2015-08-24 LAB — ETHANOL: Alcohol, Ethyl (B): <5 mg/dL (ref <5)

## 2015-08-24 LAB — ACETAMINOPHEN LEVEL

## 2015-08-24 MED ORDER — LORAZEPAM 0.5 MG PO TABS
0.5000 mg | ORAL_TABLET | Freq: Three times a day (TID) | ORAL | Status: DC | PRN
  Administered 2015-08-24: 0.5 mg via ORAL
  Filled 2015-08-24: qty 1

## 2015-08-24 MED ORDER — FLUOXETINE HCL 20 MG PO CAPS
40.0000 mg | ORAL_CAPSULE | Freq: Every day | ORAL | Status: DC
  Administered 2015-08-24 – 2015-08-25 (×2): 40 mg via ORAL
  Filled 2015-08-24 (×2): qty 2

## 2015-08-24 NOTE — ED Notes (Signed)
Patient dressed out, wanded by security. Attempted to call report, nurses in shift changed and refusing at this time. Will attempt on next shift. 

## 2015-08-24 NOTE — ED Notes (Signed)
Per EMS- Patient reports 2 recent deaths in her family and does not feel like getting up or doing anything for herself. Patient denies SI/Hi

## 2015-08-24 NOTE — ED Notes (Signed)
Patient signed release for her son and ex husband, see paper chart. Patient had nurse at home with her daughter and nurse and exhusband called to determine if ex should take their daughter.

## 2015-08-24 NOTE — [facility] Counselor (Signed)
[facility] Assessment Progress Note  Writer spoke to [facility] Pickering concerning need for TTS assessment for pt as all notes indicate that pt is denying SI/HI/AVH. EDP Pickering indicates that pt is very depressed and having a hard time getting out of bed and he believes pt needs an IOP program. Writer shared that a referral could be given to pt for IOP. EDP Pickering stated that he feels pt needs "more than a piece of paper". It was decided that EDP Pickering would take out the TTS consult and put in a consult for psychiatry, as he feels the pt needs to be seen by psychiatry for her severe depression. Pt will be seen by psychiatry in the AM.   Johny ShockSamantha M. Ladona Ridgelaylor, MS, NCC, LPCA Counselor

## 2015-08-24 NOTE — ED Notes (Signed)
Contact #  [phone]  Jane Munoz

## 2015-08-24 NOTE — ED Notes (Signed)
Patient appears flat, sad. Denies Si, HI, AVH. States that she has dealt with depression since a divorce. Reports that her medications help her sleep. Denies any changes to weight or appetite in recent weeks. Reports feeling anxious.  Encouragement offered. Oriented to unit. Beverage provided.  Q 15 safety checks in place.

## 2015-08-24 NOTE — ED Notes (Signed)
Patient states "I am very sad, that's all". Patient reintereated the family deaths and states she has a special needs daughter that she takes care of. Patient denies any SI/HI, visual or auditory hallucinations. Patient also denies any alcohol or drug use.

## 2015-08-24 NOTE — ED Provider Notes (Signed)
CSN: [id number]     Arrival date & time 08/24/15  1618 History   First MD Initiated Contact with Patient 08/24/15 1635     Chief Complaint  Patient presents with  . Medical Clearance    The history is provided by the patient.  Patient came in for depression. Does have a history of depression. She is on medications for it. States that she's had 2 family members not recently and is hitting her heart. States she just is having trouble living. Is not suicidal or homicidal. She states she has a hard time getting out of bed. Denies substance abuse. No hallucinations. She states she is continuing to take her medications.  Past Medical History  Diagnosis Date  . Anxiety and depression   . Vertigo     chronic, s/p eval with cardiology, neurology and ENT  . H/O varicella   . Blood transfusion   . H/O blood clots     DVT with pregnancy  . H/O bladder infections   . Leaking of urine   . Preterm labor   . H/O dysmenorrhea 05/08/11  . Adenomyosis 05/08/11  . Pelvic pain 1/2/`13  . Heart murmur   . Colon polyps   . Elevated d-dimer- 1.29 on Oct 27th 2014- CTA neg for PE, LE venous dopplers neg for DVT 03/10/13 03/06/2013   Past Surgical History  Procedure Laterality Date  . Hernia repair    . Wisdom tooth extraction    . Cesarean section       x 3   Family History  Problem Relation Age of Onset  . Cancer Mother     Abdominal cancer  . Heart Problems Mother   . Diabetes Father   . Hypertension Father   . Asthma Sister   . Arthritis Sister   . Heart disease Mother   . Heart disease Sister   . CVA Maternal Aunt   . Diabetes Sister    Social History  Substance Use Topics  . Smoking status: Never Smoker   . Smokeless tobacco: Never Used  . Alcohol Use: No   OB History    Gravida Para Term Preterm AB TAB SAB Ectopic Multiple Living   4 3 2 1 1  1   3      Review of Systems  Constitutional: Positive for appetite change and fatigue. Negative for activity change.  Eyes: Negative for  pain.  Respiratory: Negative for chest tightness and shortness of breath.   Cardiovascular: Negative for chest pain and leg swelling.  Gastrointestinal: Negative for nausea, vomiting, abdominal pain and diarrhea.  Genitourinary: Negative for flank pain.  Musculoskeletal: Negative for back pain and neck stiffness.  Skin: Negative for rash.  Neurological: Negative for weakness, numbness and headaches.  Psychiatric/Behavioral: Positive for dysphoric mood. Negative for behavioral problems.      Allergies  Review of patient's allergies indicates no known allergies.  Home Medications   Prior to Admission medications   Medication Sig Start Date End Date Taking? Authorizing Provider  FLUoxetine (PROZAC) 40 MG capsule Take 40 mg by mouth daily.   Yes Historical Provider, MD  LORazepam (ATIVAN) 0.5 MG tablet Take 0.5 mg by mouth every 8 (eight) hours as needed for anxiety.   Yes Historical Provider, MD  Vitamin D, Ergocalciferol, (DRISDOL) 50000 units CAPS capsule Take 50,000 Units by mouth every 7 (seven) days.   Yes Historical Provider, MD  norethindrone (AYGESTIN) 5 MG tablet Take 1 tablet (5 mg total) by mouth 2 (two) times daily.  Patient not taking: Reported on 08/24/2015 02/27/15   Raelyn Mora, CNM  oxyCODONE-acetaminophen (ROXICET) 5-325 MG tablet Take 1 tablet by mouth every 4 (four) hours as needed for severe pain. Patient not taking: Reported on 08/24/2015 02/27/15   Raelyn Mora, CNM   BP 109/76 mmHg  Pulse 112  Temp(Src) 98.3 F (36.8 C) (Oral)  Resp 16  Ht 5' (1.524 m)  Wt 176 lb (79.833 kg)  BMI 34.37 kg/m2  SpO2 100%  LMP 10/04/2013 Physical Exam  Constitutional: She is oriented to person, place, and time. She appears well-developed and well-nourished.  Patient is in purple scrubs and has a sheet wrapped around her head exposing her face.  HENT:  Head: Normocephalic.  Eyes: Pupils are equal, round, and reactive to light.  Neck: Normal range of motion. Neck supple.   Cardiovascular: Normal rate, regular rhythm and normal heart sounds.   Pulmonary/Chest: Effort normal and breath sounds normal. No respiratory distress.  Abdominal: Soft. Bowel sounds are normal. She exhibits no distension.  Neurological: She is alert and oriented to person, place, and time.  Skin: Skin is warm and dry.  Psychiatric: Her speech is normal.  Patient appears depressed  Nursing note and vitals reviewed.   ED Course  Procedures (including critical care time) Labs Review Labs Reviewed  ACETAMINOPHEN LEVEL - Abnormal; Notable for the following:    Acetaminophen (Tylenol), Serum <10 (*)    All other components within normal limits  COMPREHENSIVE METABOLIC PANEL  ETHANOL  SALICYLATE LEVEL  CBC  URINE RAPID DRUG SCREEN, HOSP PERFORMED    Imaging Review No results found. I have personally reviewed and evaluated these images and lab results as part of my medical decision-making.   EKG Interpretation None      MDM   Final diagnoses:  Grief  Depression  Severe major depression, single episode, without psychotic features [facility])    Patient with severe depression and grief. Has had 2 family members die recently. Not frankly suicidal  but is severely depressed. Having difficulty getting out of bed. Discussed with TTS that states that she does not have criteria should she is not suicidal or homicidal. I think she would benefit from a psychiatric evaluation I put in a psychiatric consult.   Patient does not want to stay overnight. At this point she is voluntary I think she does not have criteria for involuntary commitment. She states she can stay a little longer. I feel as if further evaluation possible setting up outpatient treatment would help her.    Benjiman Core, MD 08/26/15 [phone]

## 2015-08-25 ENCOUNTER — Inpatient Hospital Stay
Admission: RE | Admit: 2015-08-25 | Discharge: 2015-09-01 | DRG: 885 | Disposition: A | Discharge status: Home / Self Care | Payer: Medicaid Other | Source: Intra-hospital | Attending: Psychiatry | Admitting: Psychiatry

## 2015-08-25 DIAGNOSIS — Z8261 Family history of arthritis: Secondary | ICD-10-CM

## 2015-08-25 DIAGNOSIS — Z813 Family history of other psychoactive substance abuse and dependence: Secondary | ICD-10-CM

## 2015-08-25 DIAGNOSIS — Z8249 Family history of ischemic heart disease and other diseases of the circulatory system: Secondary | ICD-10-CM

## 2015-08-25 DIAGNOSIS — R45851 Suicidal ideations: Secondary | ICD-10-CM

## 2015-08-25 DIAGNOSIS — G4700 Insomnia, unspecified: Secondary | ICD-10-CM | POA: Diagnosis present

## 2015-08-25 DIAGNOSIS — Z833 Family history of diabetes mellitus: Secondary | ICD-10-CM | POA: Diagnosis not present

## 2015-08-25 DIAGNOSIS — Z800 Family history of malignant neoplasm of digestive organs: Secondary | ICD-10-CM | POA: Diagnosis not present

## 2015-08-25 DIAGNOSIS — Z825 Family history of asthma and other chronic lower respiratory diseases: Secondary | ICD-10-CM

## 2015-08-25 DIAGNOSIS — F411 Generalized anxiety disorder: Secondary | ICD-10-CM | POA: Diagnosis present

## 2015-08-25 DIAGNOSIS — F322 Major depressive disorder, single episode, severe without psychotic features: Secondary | ICD-10-CM | POA: Diagnosis present

## 2015-08-25 DIAGNOSIS — E559 Vitamin D deficiency, unspecified: Secondary | ICD-10-CM | POA: Diagnosis present

## 2015-08-25 DIAGNOSIS — Z9889 Other specified postprocedural states: Secondary | ICD-10-CM

## 2015-08-25 DIAGNOSIS — Z823 Family history of stroke: Secondary | ICD-10-CM

## 2015-08-25 DIAGNOSIS — Z86010 Personal history of colonic polyps: Secondary | ICD-10-CM | POA: Diagnosis not present

## 2015-08-25 DIAGNOSIS — Z818 Family history of other mental and behavioral disorders: Secondary | ICD-10-CM | POA: Diagnosis not present

## 2015-08-25 DIAGNOSIS — Z79899 Other long term (current) drug therapy: Secondary | ICD-10-CM | POA: Diagnosis not present

## 2015-08-25 DIAGNOSIS — F4321 Adjustment disorder with depressed mood: Secondary | ICD-10-CM | POA: Diagnosis not present

## 2015-08-25 MED ORDER — BUPROPION HCL ER (XL) 150 MG PO TB24
150.0000 mg | ORAL_TABLET | Freq: Every day | ORAL | Status: DC
  Filled 2015-08-25: qty 1

## 2015-08-25 MED ORDER — ALUM & MAG HYDROXIDE-SIMETH 200-200-20 MG/5ML PO SUSP
30.0000 mL | ORAL | Status: DC | PRN
  Administered 2015-08-27 – 2015-09-01 (×2): 30 mL via ORAL
  Filled 2015-08-25 (×2): qty 30

## 2015-08-25 MED ORDER — HYDROXYZINE HCL 25 MG PO TABS
25.0000 mg | ORAL_TABLET | Freq: Three times a day (TID) | ORAL | Status: DC | PRN
Start: 2015-08-25 — End: 2015-08-25

## 2015-08-25 MED ORDER — MAGNESIUM HYDROXIDE 400 MG/5ML PO SUSP: 30.0000 mL | Freq: Every day | ORAL | Status: DC | PRN

## 2015-08-25 MED ORDER — ACETAMINOPHEN 325 MG PO TABS: 650.0000 mg | ORAL_TABLET | Freq: Four times a day (QID) | ORAL | Status: DC | PRN

## 2015-08-25 MED ORDER — FLUOXETINE HCL 20 MG PO CAPS
40.0000 mg | ORAL_CAPSULE | Freq: Every day | ORAL | Status: DC
  Filled 2015-08-25: qty 2

## 2015-08-25 MED ORDER — TRAZODONE HCL 50 MG PO TABS: 50.0000 mg | ORAL_TABLET | Freq: Every day | ORAL | Status: DC

## 2015-08-25 MED ORDER — HYDROXYZINE HCL 25 MG PO TABS
25.0000 mg | ORAL_TABLET | Freq: Three times a day (TID) | ORAL | Status: DC | PRN
  Administered 2015-08-25: 25 mg via ORAL
  Filled 2015-08-25: qty 1

## 2015-08-25 MED ORDER — BUPROPION HCL ER (XL) 150 MG PO TB24
150.0000 mg | ORAL_TABLET | Freq: Every day | ORAL | Status: DC
  Administered 2015-08-25: 150 mg via ORAL
  Filled 2015-08-25: qty 1

## 2015-08-25 NOTE — BH Assessment (Addendum)
Patient has been [facility].  Accepting physician is Dr. Ardyth HarpsHernandez.  Attending Physician will be Dr. Ardyth HarpsHernandez.  Patient [facility] room 302, [facility] [place] .  Call report to [phone].  Representative/Transfer Coordinator is Nicola Girtalvin  [facility] Staff (JoVea & Olivia Mackieressa, TTS) made aware of acceptance.  [facility] with [facility] and the patient will receive a Quran, special diet. As far as the "head Covering (Hijab)," she will not be able to wear a Hijab due to safety issues, therefore she will be able to wore the same (bed sheet) she is using in [facility].

## 2015-08-25 NOTE — ED Notes (Signed)
Pt is calm and cooperative.  She is very soft spoken.  Denies S/I, H/I, and AVH.  15 minute checks and video monitoring continue.

## 2015-08-25 NOTE — Progress Notes (Signed)
Patient pleasant and cooperative during admission assessment. Patient denies SI/HI at this time. Patient denies AVH. Patient informed of fall risk status, fall risk assessed "low" at this time. Patient oriented to unit/staff/room. Patient denies any questions/concerns at this time. Patient safe on unit with Q15 minute checks for safety. Skin assessment & body search done ,no contraband found. 

## 2015-08-25 NOTE — ED Notes (Signed)
Pt informed us that due to religious beliefs she is unable to eat pork, jello, or marshmellows.

## 2015-08-25 NOTE — Progress Notes (Signed)
CSW assisted TTS and faxed patient's information to various facilities:  [facility]  [place], [place] [phone] ED CSW 08/25/2015 12:59 PM

## 2015-08-25 NOTE — Tx Team (Signed)
Initial Interdisciplinary Treatment Plan   PATIENT STRESSORS: Financial difficulties Marital or family conflict Medication change or noncompliance Occupational concerns   PATIENT STRENGTHS: Average or above average intelligence Communication skills Motivation for treatment/growth Religious Affiliation   PROBLEM LIST: Problem List/Patient Goals Date to be addressed Date deferred Reason deferred Estimated date of resolution  Major depression 08/25/2015     Generalized Anxiety 08/25/2015                                                DISCHARGE CRITERIA:  Ability to meet basic life and health needs Adequate post-discharge living arrangements Medical problems require only outpatient monitoring Verbal commitment to aftercare and medication compliance  PRELIMINARY DISCHARGE PLAN: Attend aftercare/continuing care group Return to previous living arrangement Return to previous work or school arrangements  PATIENT/FAMIILY INVOLVEMENT: This treatment plan has been presented to and reviewed with the patient, Jane Munoz, and/or family member,.  The patient and family have been given the opportunity to ask questions and make suggestions.  Margo CommonGigi George Tomorrow Dehaas 08/25/2015, 6:51 PM

## 2015-08-25 NOTE — ED Notes (Signed)
Pt discharged ambulatory with [place] driver.  All belongings were returned to pt. 

## 2015-08-25 NOTE — BH Assessment (Addendum)
Assessment Note  Jane Munoz is an 45 y.o. female presenting voluntarily via EMS for increased depression. 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 Patient reports that she is unable to defend her thesis and she recently learned this information. Patient reports that she also has a limited amount of money and postponing her Burnard Hawthorne has her anxious that she may run out of money. Patient reports that she was under the stress of school and finances and two family members passed away within three days. Patient reports that Wednesday she woke up and was unable to get out of bed and function due to depression and sadness and reports that her 47 year old daughter's caregiver called 911. Patient endorses symptoms of depression as; fatigue, tearfulness, feeling worthless, feeling hopeless, and overeating. Patient also reports that she has decreased grooming and the inability to get out of bed.  Patient denies SI and history of attempts. Patient denies self injurious behaviors. Patient denies HI and history of aggression. Patient denies access to firearms and weapons. Patient denies pending charges and upcoming court dates. Patient denies active probation.  Patient denies AVH and does not appear to be responding to internal stimuli.   Patient is alert and oriented x4. Patient is calm and cooperative during the assessment. Patient appears depressed and her affect is congruent. Patient denies history of trauma/abuse. Patient was dressed in scrubs but was covered with blankets due to her religion. Patient reports that she does not eat pork, marshmallows, and jello. Patient reports that she has to keep her head covered at all times. Patient requested a Ladell Heads ran to read. Patient denies inpatient treatment. Patient reports [facility] and is prescribed  of Prozac and .  of Lorazepam which she  takes as prescribed. Patient denies use of drugs and alcohol and her UDS and BAL are both clear at time of arrival.   Dr. Jannifer Franklin and Nanine Means, PMH-NP recommend inpatient treatment  Diagnosis: Major Depressive Disorder, Recurrent, Severe  Past Medical History:  Past Medical History  Diagnosis Date  . Anxiety and depression   . Vertigo     chronic, s/p eval with cardiology, neurology and ENT  . H/O varicella   . Blood transfusion   . H/O blood clots     DVT with pregnancy  . H/O bladder infections   . Leaking of urine   . Preterm labor   . H/O dysmenorrhea 05/08/11  . Adenomyosis 05/08/11  . Pelvic pain 1/2/`13  . Heart murmur   . Colon polyps   . Elevated d-dimer- 1.29 on Oct 27th 2014- CTA neg for PE, LE venous dopplers neg for DVT 03/10/13 03/06/2013    Past Surgical History  Procedure Laterality Date  . Hernia repair    . Wisdom tooth extraction    . Cesarean section       x 3    Family History:  Family History  Problem Relation Age of Onset  . Cancer Mother     Abdominal cancer  . Heart Problems Mother   . Diabetes Father   . Hypertension Father   . Asthma Sister   . Arthritis Sister   . Heart disease Mother   . Heart disease Sister   . CVA Maternal Aunt   . Diabetes Sister     Social History:  reports that she has never smoked. She has never used smokeless tobacco. She  reports that she does not drink alcohol or use illicit drugs.  Additional Social History:  Alcohol / Drug Use Pain Medications: See PTA Prescriptions: See PTA Over the Counter: See PTA History of alcohol / drug use?: No history of alcohol / drug abuse  CIWA: CIWA-Ar BP: 113/62 mmHg Pulse Rate: 71 COWS:    Allergies: No Known Allergies  Home Medications:  (Not in a hospital admission)  OB/GYN Status:  Patient's last menstrual period was 10/04/2013.  General Assessment Data Location of Assessment: [facility] TTS Assessment: In system Is this a Tele or Face-to-Face Assessment?:  Face-to-Face Is this an Initial Assessment or a Re-assessment for this encounter?: Initial Assessment Marital status: Divorced [place] name: Consuela Mimesbusalih Is patient pregnant?: No Pregnancy Status: No Living Arrangements: Children Can pt return to current living arrangement?: Yes Admission Status: Voluntary Is patient capable of signing voluntary admission?: Yes Referral Source: Self/Family/Friend     Crisis Care Plan Living Arrangements: Children Name of Psychiatrist: Dr. 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 : No Suicidal Intent: No Has patient had any suicidal intent within the past 6 months prior to admission? : No Is patient at risk for suicide?: No Suicidal Plan?: No Has patient had any suicidal plan within the past 6 months prior to admission? : No Access to Means: No What has been your use of drugs/alcohol within the last 12 months?: Denies Previous Attempts/Gestures: No How many times?: 0 Other Self Harm Risks: Denies Triggers for Past Attempts: None known Intentional Self Injurious Behavior: None Family Suicide History: No Recent stressful life event(s): Loss (Comment), Financial Problems, Other (Comment) (school) Persecutory voices/beliefs?: No Depression: Yes Depression Symptoms: Despondent, Tearfulness, Fatigue, Feeling worthless/self pity Substance abuse history and/or treatment for substance abuse?: No Suicide prevention information given to non-admitted patients: Not applicable  Risk to Others within the past 6 months Homicidal Ideation: No Does patient have any lifetime risk of violence toward others beyond the six months prior to admission? : No Thoughts of Harm to Others: No Current Homicidal Intent: No Current Homicidal Plan:  No Access to Homicidal Means: No Identified Victim: Denies History of harm to others?: No Assessment of Violence: None Noted Violent Behavior Description: Denies Does patient have access to weapons?: No Criminal Charges Pending?: No Does patient have a court date: No Is patient on probation?: No  Psychosis Hallucinations: None noted Delusions: None noted  Mental Status Report Appearance/Hygiene: Unable to Assess Eye Contact: Good Motor Activity: Unremarkable Speech: Logical/coherent, Slow Level of Consciousness: Alert Mood: Depressed Affect: Depressed Anxiety Level: None Thought Processes: Coherent, Relevant Judgement: Unimpaired Orientation: Person, Place, Time, Situation, Appropriate for developmental age Obsessive Compulsive Thoughts/Behaviors: None  Cognitive Functioning Concentration: Poor Memory: Recent Intact, Remote Intact IQ: Average Insight: Good Impulse Control: Good Appetite: Good (reports she overeats ) Weight Loss: 0 Weight Gain: 0 Sleep: No Change Vegetative Symptoms: Staying in bed, Not bathing, Decreased grooming  ADLScreening [facility] Assessment Services) Patient's cognitive ability adequate to safely complete daily activities?: Yes Patient able to express need for assistance with ADLs?: Yes Independently performs ADLs?: Yes (appropriate for developmental age)  Prior Inpatient Therapy Prior Inpatient Therapy: No Prior Therapy Dates: N/A Prior Therapy Facilty/Provider(s): N/A Reason for Treatment: N/A  Prior Outpatient Therapy Prior Outpatient Therapy: Yes Prior Therapy Dates:  Current Prior Therapy Facilty/Provider(s): [facility] Reason for Treatment: Depression Does patient have an ACCT team?: No Does patient have Intensive In-House Services?  : No Does patient have [facility] services? : No Does patient have P4CC services?: No  ADL Screening (condition at time of admission) Patient's cognitive ability adequate to safely  complete daily activities?: Yes Is the patient deaf or have difficulty hearing?: No Does the patient have difficulty seeing, even when wearing glasses/contacts?: No Does the patient have difficulty concentrating, remembering, or making decisions?: No Patient able to express need for assistance with ADLs?: Yes Does the patient have difficulty dressing or bathing?: No Independently performs ADLs?: Yes (appropriate for developmental age) Does the patient have difficulty walking or climbing stairs?: No Weakness of Legs: None Weakness of Arms/Hands: None  Home Assistive Devices/Equipment Home Assistive Devices/Equipment: None  Therapy Consults (therapy consults require a physician order) PT Evaluation Needed: No OT Evalulation Needed: No SLP Evaluation Needed: No Abuse/Neglect Assessment (Assessment to be complete while patient is alone) Physical Abuse: Denies Verbal Abuse: Denies Sexual Abuse: Denies Exploitation of patient/patient's resources: Denies Self-Neglect: Denies Values / Beliefs Cultural Requests During Hospitalization: Diet (comment) (does not eat pork, jello, marshallows) Spiritual Requests During Hospitalization: Religious literature 22) Consults Spiritual Care Consult Needed: No Social Work Consult Needed: No Merchant navy officer (For Healthcare) Does patient have an advance directive?: No Would patient like information on creating an advanced directive?: No - patient declined information    Additional Information 1:1 In Past 12 Months?: No CIRT Risk: No Elopement Risk: No Does patient have medical clearance?: Yes     Disposition:  Disposition Initial Assessment Completed for this Encounter: Yes Disposition of Patient: Inpatient treatment program (Per Nanine Means, DNP) Type of inpatient treatment program: Adult  On Site Evaluation by:   Reviewed with Physician:    Icy Fuhrmann 08/25/2015 3:48 PM

## 2015-08-25 NOTE — Progress Notes (Signed)
This writer spoke with Jane Munoz at [facility] who has accepted this patient per Dr. Hernandez.  All supporting documentation was signed and the patient agreed with the transfer for treatment.     Eula Mazzola, MSW, LCSW, LCAS [facility] Triage Specialist [phone]  

## 2015-08-25 NOTE — Consult Note (Signed)
[facility] Face-to-Face Psychiatry Consult   Reason for Consult:  Depression with suicidal ideations and a plan Referring Physician:  EDP Patient Identification: Jane Munoz MRN:  [id number] Principal Diagnosis: Severe major depression, single episode, without psychotic features ([place]) Diagnosis:   Patient Active Problem List   Diagnosis Date Noted  . Severe major depression, single episode, without psychotic features ([place]) [F32.2] 08/25/2015    Priority: High  . GAD (generalized anxiety disorder) [F41.1] 08/25/2015    Priority: High  . Hearing loss [H91.90] 08/11/2014  . Vertigo [R42] 06/13/2013  . Anxiety and depression [F41.8] 03/22/2013    Total Time spent with patient: 45 minutes  Subjective:   Jane Munoz is a 45 y.o. female patient admitted with depression and a suicide plan.  HPI:  45 yo female who presented to the ED with increase in depression and suicidal ideations, cannot contract for safety.  She has had two family members die in the past three days and overwhelmed with grief and sadness.  Visibly depressed, unable to get out of bed and function, suicidal ideations and plan to overdose, cannot contract for safety.  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  She has little support.  Wellbutrin did work for her in the past  Past Psychiatric History: depression, anxiety  Risk to Self: yes Risk to Others:  none Prior Inpatient Therapy:  no Prior Outpatient Therapy:  yes, neuropsychiatric  Past Medical History:  Past Medical History  Diagnosis Date  . Anxiety and depression   . Vertigo     chronic, s/p eval with cardiology, neurology and ENT  . H/O varicella   . Blood transfusion   . H/O blood clots     DVT with pregnancy  . H/O bladder infections   . Leaking of urine   . Preterm labor   . H/O dysmenorrhea 05/08/11  . Adenomyosis 05/08/11  . Pelvic pain 1/2/`13  . Heart murmur   . Colon polyps   . Elevated  d-dimer- 1.29 on Oct 27th 2014- CTA neg for PE, LE venous dopplers neg for DVT 03/10/13 03/06/2013    Past Surgical History  Procedure Laterality Date  . Hernia repair    . Wisdom tooth extraction    . Cesarean section       x 3   Family History:  Family History  Problem Relation Age of Onset  . Cancer Mother     Abdominal cancer  . Heart Problems Mother   . Diabetes Father   . Hypertension Father   . Asthma Sister   . Arthritis Sister   . Heart disease Mother   . Heart disease Sister   . CVA Maternal Aunt   . Diabetes Sister    Family Psychiatric  History: none Social History:  History  Alcohol Use No     History  Drug Use No    Social History   Social History  . Marital Status: Divorced    Spouse Name: N/A  . Number of Children: 3  . Years of Education: Grad   Occupational History  . TEACHER    Social History Main Topics  . Smoking status: Never Smoker   . Smokeless tobacco: Never Used  . Alcohol Use: No  . Drug Use: No  . Sexual Activity: Not Asked   Other Topics Concern  . 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    No regular exercise; diet is not great   Muslim         Additional Social History:    Allergies:  No Known Allergies  Labs:  Results for orders placed or performed during the hospital encounter of 08/24/15 (from the past 48 hour(s))  Comprehensive metabolic panel     Status: None   Collection Time: 08/24/15  5:22 PM  Result Value Ref Range   Sodium 138 135 - 145 mmol/L   Potassium 3.8 3.5 - 5.1 mmol/L   Chloride 103 101 - 111 mmol/L   CO2 24 22 - 32 mmol/L   Glucose, Bld 84 65 - 99 mg/dL   BUN 13 6 - 20 mg/dL   Creatinine, Ser 0.47 0.44 - 1.00 mg/dL   Calcium 9.6 8.9 - 10.3 mg/dL   Total Protein 8.1 6.5 - 8.1 g/dL    Albumin 4.0 3.5 - 5.0 g/dL   AST 29 15 - 41 U/L   ALT 26 14 - 54 U/L   Alkaline Phosphatase 121 38 - 126 U/L   Total Bilirubin 0.4 0.3 - 1.2 mg/dL   GFR calc non Af Amer >60 >60 mL/min   GFR calc Af Amer >60 >60 mL/min    Comment: (NOTE) The eGFR has been calculated using the CKD EPI equation. This calculation has not been validated in all clinical situations. eGFR's persistently <60 mL/min signify possible Chronic Kidney Disease.    Anion gap 11 5 - 15  Ethanol (ETOH)     Status: None   Collection Time: 08/24/15  5:22 PM  Result Value Ref Range   Alcohol, Ethyl (B) <5 <5 mg/dL    Comment:        LOWEST DETECTABLE LIMIT FOR SERUM ALCOHOL IS 5 mg/dL FOR MEDICAL PURPOSES ONLY   Salicylate level     Status: None   Collection Time: 08/24/15  5:22 PM  Result Value Ref Range   Salicylate Lvl <1.6 2.8 - 30.0 mg/dL  Acetaminophen level     Status: Abnormal   Collection Time: 08/24/15  5:22 PM  Result Value Ref Range   Acetaminophen (Tylenol), Serum <10 (L) 10 - 30 ug/mL    Comment:        THERAPEUTIC CONCENTRATIONS VARY SIGNIFICANTLY. A RANGE OF 10-30 ug/mL MAY BE AN EFFECTIVE CONCENTRATION FOR MANY PATIENTS. HOWEVER, SOME ARE BEST TREATED AT CONCENTRATIONS OUTSIDE THIS RANGE. ACETAMINOPHEN CONCENTRATIONS >150 ug/mL AT 4 HOURS AFTER INGESTION AND >50 ug/mL AT 12 HOURS AFTER INGESTION ARE OFTEN ASSOCIATED WITH TOXIC REACTIONS.   CBC     Status: None   Collection Time: 08/24/15  5:22 PM  Result Value Ref Range   WBC 5.7 4.0 - 10.5 K/uL   RBC 4.97 3.87 - 5.11 MIL/uL   Hemoglobin 14.7 12.0 - 15.0 g/dL   HCT 42.5 36.0 - 46.0 %   MCV 85.5 78.0 - 100.0 fL   MCH 29.6 26.0 - 34.0 pg   MCHC 34.6 30.0 - 36.0 g/dL   RDW 13.4 11.5 - 15.5 %   Platelets 274 150 - 400 K/uL  Urine rapid drug screen (hosp performed) (Not at [facility])     Status: None   Collection Time: 08/24/15  6:53 PM  Result Value Ref Range   Opiates NONE DETECTED NONE DETECTED   Cocaine NONE DETECTED NONE  DETECTED  Benzodiazepines NONE DETECTED NONE DETECTED   Amphetamines NONE DETECTED NONE DETECTED   Tetrahydrocannabinol NONE DETECTED NONE DETECTED   Barbiturates NONE DETECTED NONE DETECTED    Comment:        DRUG SCREEN FOR MEDICAL PURPOSES ONLY.  IF CONFIRMATION IS NEEDED FOR ANY PURPOSE, NOTIFY LAB WITHIN 5 DAYS.        LOWEST DETECTABLE LIMITS FOR URINE DRUG SCREEN Drug Class       Cutoff (ng/mL) Amphetamine      1000 Barbiturate      200 Benzodiazepine   500 Tricyclics       938 Opiates          300 Cocaine          300 THC              50     Current Facility-Administered Medications  Medication Dose Route Frequency Provider Last Rate Last Dose  . buPROPion (WELLBUTRIN XL) 24 hr tablet 150 mg  150 mg Oral Daily Lantz Hermann, MD      . FLUoxetine (PROZAC) capsule 40 mg  40 mg Oral Daily Davonna Belling, MD   40 mg at 08/25/15 1011  . hydrOXYzine (ATARAX/VISTARIL) tablet 25 mg  25 mg Oral TID PRN Corena Pilgrim, MD      . traZODone (DESYREL) tablet 50 mg  50 mg Oral QHS Corena Pilgrim, MD       Current Outpatient Prescriptions  Medication Sig Dispense Refill  . FLUoxetine (PROZAC) 40 MG capsule Take 40 mg by mouth daily.    Marland Kitchen LORazepam (ATIVAN) 0.5 MG tablet Take 0.5 mg by mouth every 8 (eight) hours as needed for anxiety.    . Vitamin D, Ergocalciferol, (DRISDOL) 50000 units CAPS capsule Take 50,000 Units by mouth every 7 (seven) days.    . norethindrone (AYGESTIN) 5 MG tablet Take 1 tablet (5 mg total) by mouth 2 (two) times daily. (Patient not taking: Reported on 08/24/2015) 20 tablet 0  . oxyCODONE-acetaminophen (ROXICET) 5-325 MG tablet Take 1 tablet by mouth every 4 (four) hours as needed for severe pain. (Patient not taking: Reported on 08/24/2015) 20 tablet 0    Musculoskeletal: Strength & Muscle Tone: within normal limits Gait & Station: normal Patient leans: N/A  Psychiatric Specialty Exam: Review of Systems  Constitutional: Negative.   HENT:  Negative.   Eyes: Negative.   Respiratory: Negative.   Cardiovascular: Negative.   Gastrointestinal: Negative.   Genitourinary: Negative.   Musculoskeletal: Negative.   Skin: Negative.   Neurological: Negative.   Endo/Heme/Allergies: Negative.   Psychiatric/Behavioral: Positive for depression and suicidal ideas.    Blood pressure 97/59, pulse 78, temperature 98.6 F (37 C), temperature source Oral, resp. rate 16, height 5' (1.524 m), weight 79.833 kg (176 lb), last menstrual period 10/04/2013, SpO2 100 %.Body mass index is 34.37 kg/(m^2).  General Appearance: Casual  Eye Contact::  Fair  Speech:  Normal Rate  Volume:  Decreased  Mood:  Depressed  Affect:  Congruent  Thought Process:  Coherent  Orientation:  Full (Time, Place, and Person)  Thought Content:  Rumination  Suicidal Thoughts:  Yes.  with intent/plan  Homicidal Thoughts:  No  Memory:  Immediate;   Fair Recent;   Fair Remote;   Fair  Judgement:  Impaired  Insight:  Fair  Psychomotor Activity:  Decreased  Concentration:  Fair  Recall:  [place] of Knowledge:Good  Language: Good  Akathisia:  No  Handed:  Right  AIMS (if indicated):  Assets:  Housing Leisure Time Physical Health Resilience Vocational/Educational  ADL's:  Intact  Cognition: WNL  Sleep:      Treatment Plan Summary: Daily contact with patient to assess and evaluate symptoms and progress in treatment, Medication management and Plan major depressive disorder, recurrent, severe without psychosis:  -Crisis stabilization -Medication management:  Discontinue her Ativan 0.5 mg for anxiety.  Continue her Prozac 40 mg daily for depression.  Start Wellbutrin 150 mg daily for depression, Trazodone 50 mg at bedtime for sleep issues, and Vistaril 25 mg TID PRN anxiety. -Individual counseling  Disposition: Recommend psychiatric Inpatient admission when medically cleared.  Waylan Boga, NP 08/25/2015 11:40 AM Patient seen face-to-face for psychiatric  evaluation, chart reviewed and case discussed with the physician extender and developed treatment plan. Reviewed the information documented and agree with the treatment plan. Corena Pilgrim, MD

## 2015-08-25 NOTE — Progress Notes (Signed)
This Clinical research associatewriter spoke with Jerilynn Somalvin, [facility] who confirms this patient is under review for potential placement.     Maryelizabeth Rowanressa Lacosta Hargan, MSW, Clare CharonLCSW, LCAS [facility] Triage Specialist [phone]

## 2015-08-26 DIAGNOSIS — F322 Major depressive disorder, single episode, severe without psychotic features: Principal | ICD-10-CM

## 2015-08-26 LAB — URINALYSIS COMPLETE WITH MICROSCOPIC (ARMC ONLY)
BILIRUBIN URINE: NEGATIVE
Bacteria, UA: NONE SEEN
GLUCOSE, UA: NEGATIVE mg/dL
HGB URINE DIPSTICK: NEGATIVE
Ketones, ur: NEGATIVE mg/dL
Leukocytes, UA: NEGATIVE
NITRITE: NEGATIVE
Protein, ur: NEGATIVE mg/dL
Specific Gravity, Urine: 1.005 (ref 1.005–1.030)
pH: 6 (ref 5.0–8.0)

## 2015-08-26 LAB — VITAMIN B12: VITAMIN B 12: 470 pg/mL (ref 180–914)

## 2015-08-26 LAB — TSH: TSH: 2.524 u[IU]/mL (ref 0.350–4.500)

## 2015-08-26 MED ORDER — MIRTAZAPINE 15 MG PO TABS
15.0000 mg | ORAL_TABLET | Freq: Every day | ORAL | Status: DC
  Administered 2015-08-26 – 2015-08-27 (×2): 15 mg via ORAL
  Filled 2015-08-26 (×2): qty 1

## 2015-08-26 MED ORDER — VITAMIN D (ERGOCALCIFEROL) 1.25 MG (50000 UNIT) PO CAPS
50000.0000 [IU] | ORAL_CAPSULE | ORAL | Status: DC
  Administered 2015-08-26: 50000 [IU] via ORAL
  Filled 2015-08-26 (×2): qty 1

## 2015-08-26 MED ORDER — FLUOXETINE HCL 20 MG PO CAPS
60.0000 mg | ORAL_CAPSULE | Freq: Every day | ORAL | Status: DC
  Administered 2015-08-26 – 2015-09-01 (×7): 60 mg via ORAL
  Filled 2015-08-26 (×7): qty 3

## 2015-08-26 MED ORDER — LORAZEPAM 0.5 MG PO TABS
0.2500 mg | ORAL_TABLET | Freq: Three times a day (TID) | ORAL | Status: DC | PRN
  Administered 2015-08-26 – 2015-08-30 (×3): 0.25 mg via ORAL
  Filled 2015-08-26 (×3): qty 1

## 2015-08-26 NOTE — [facility] Suicide Risk Assessment (Signed)
[facility]   Nursing information obtained from:  Patient Demographic factors:  Divorced or widowed Current Mental Status:  NA Loss Factors:  Financial problems / change in socioeconomic status Historical Factors:  NA Risk Reduction Factors:  Responsible for children under 47 years of age, Living with another person, especially a relative  Total Time spent with patient: 1 hour Principal Problem: Severe major depression, single episode, without psychotic features ([facility]) Diagnosis:   Patient Active Problem List   Diagnosis Date Noted  . Severe major depression, single episode, without psychotic features ([facility]) [F32.2] 08/25/2015  . GAD (generalized anxiety disorder) [F41.1] 08/25/2015   Subjective Data:   Continued Clinical Symptoms:  Alcohol Use Disorder Identification Test Final Score (AUDIT): 0 The "Alcohol Use Disorders Identification Test", Guidelines for Use in Primary Care, Second Edition.  World Science writerHealth Organization [facility](WHO). Score between 0-7:  no or low risk or alcohol related problems. Score between 8-15:  moderate risk of alcohol related problems. Score between 16-19:  high risk of alcohol related problems. Score 20 or above:  warrants further diagnostic evaluation for alcohol dependence and treatment.   CLINICAL FACTORS:   Depression:   Insomnia Severe More than one psychiatric diagnosis Previous Psychiatric Diagnoses and Treatments    Psychiatric Specialty Exam: ROS  Blood pressure 116/73, pulse 66, temperature 98.6 F (37 C), temperature source Oral, resp. rate 20, height 5' (1.524 m), weight 78.926 kg (174 lb), last menstrual period 10/04/2013, SpO2 99 %.Body mass index is 33.98 kg/(m^2).                                                        COGNITIVE FEATURES THAT CONTRIBUTE TO RISK:  None    SUICIDE RISK:   Mild:  Suicidal ideation of limited frequency, intensity, duration, and specificity.  There are no  identifiable plans, no associated intent, mild dysphoria and related symptoms, good self-control (both objective and subjective assessment), few other risk factors, and identifiable protective factors, including available and accessible social support.  [facility]  I certify that inpatient services furnished can reasonably be expected to improve the patient's condition.   Jimmy FootmanHernandez-Gonzalez,  Tayon Parekh, MD 08/26/2015, 12:57 PM

## 2015-08-26 NOTE — H&P (Addendum)
Psychiatric Admission Assessment Adult  Patient Identification: Jane Munoz MRN:  [id number] Date of Evaluation:  08/26/2015 Chief Complaint:  Depression Principal Diagnosis: Severe major depression, single episode, without psychotic features [facility]) Diagnosis:   Patient Active Problem List   Diagnosis Date Noted  . Severe major depression, single episode, without psychotic features ([place]) [F32.2] 08/25/2015  . GAD (generalized anxiety disorder) [F41.1] 08/25/2015   History of Present Illness:  Patient is a 45 year old female originally from [place] who was brought into [facility] emergency department for evaluation due to severe depression and inability to function. The patient has a prior history of major depressive disorder and has been receiving psychiatric care and therapy since 2014. Patient follows up with Dr.Akintayo.    Patient reports undergoing significant stress. 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 In addition the patient had 2 deaths in the family his week. On Monday the grandmother of her mother stepsiblings passed away and on 2022-07-05 her great uncle passed away. Both of them died from old age.     Patient's father is in his 66s and he still lives in [place]. She is now afraid that he is going to die before she can complete her PhD. The patient states that her father always tells her when I you going to get her PhD, I want to see you as a doctor.   Patient complains of severe depression, decreased concentration and energy and increased appetite. Patient denies having suicidal ideation. States that because of her fate (patient is Muslim) suicide is not an option. She denies having auditory or visual hallucinations. She denies having symptoms consistent with mania or hypomania.  Patient came to the [place] about 20  years ago with her husband in order to complete graduate school. The patient was able to obtain a master degree at that time. For many years her main focus was to stay at home and raise their children. She has 3 children with her husband ages 74, 81 and 71 year old. This 15 year old child has autism. 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 She is now divorced from her husband they have been separated since 2007. After the divorce the patient was receiving only child support for this 67 and the 59 year old children but no alimony. The 93 year old recently had severe psychiatric issues and was hospitalized and eventually was decided that it was best for her to move in with her father who lives in [place]. The patient lost the income from this child. She is not taking care of this 54 year old child who has autism.   Patient is undergoing severe financial stress. 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 But this is not the case anymore.  Patient has family in [place]. There she has a sister and a brother. She does not have any family here in [place].    In terms of trauma the patient denies ever being physically or sexually abuse in the past or undergone any type of trauma.  Substance abuse patient denies abusing any alcohol or any illicit substances.   Signs/Symptoms: Depression Symptoms:  depressed mood, insomnia, psychomotor agitation, feelings of worthlessness/guilt, loss of energy/fatigue, increased appetite, (Hypo) Manic Symptoms:  denies Anxiety Symptoms:  Excessive Worry, Psychotic Symptoms:  Denies PTSD Symptoms: Negative   Total Time spent with patient: 1.5 hours  Past Psychiatric History: patient follows up with Dr. Karn Cassis 10 AM. She also received counseling services at her [facility]. She denies prior psychiatric  hospitalizations. She denies suicidal attempts. Patient states that Dr. Ala Bent was prescribing her with Wellbutrin 150 mg a day which she was taking along with fluoxetine 40 mg which was prescribed by her primary care provider. She ran out of the prescription for Wellbutrin has not taken it since December. However she has been taking the fluoxetine 40 mg a day.  Is the patient at risk to self? No.  Has the patient been a risk to self in the past 6 months? No.  Has the patient been a risk to self within the distant past? No.  Is the patient a risk to others? No.  Has the patient been a risk to others in the past 6 months? No.  Has the patient been a risk to others within the distant past? No.     Past Medical History: Denies any history of head trauma. She does report having seizures while visiting her family in [place] in 2014. She tells me she has had a 24-hour EEG here in the [place] that was negative. Past Medical History  Diagnosis Date  . Anxiety and depression   . Vertigo     chronic, s/p eval with cardiology, neurology and ENT  . H/O varicella   . Blood transfusion   . H/O blood clots     DVT with pregnancy  . H/O bladder infections   . Leaking of urine   . Preterm labor   . H/O dysmenorrhea 05/08/11  . Adenomyosis 05/08/11  . Pelvic pain 1/2/`13  . Heart murmur   . Colon polyps   . Elevated d-dimer- 1.29 on Oct 27th 2014- CTA neg for PE, LE venous dopplers neg for DVT 03/10/13 03/06/2013    Past Surgical History  Procedure Laterality Date  . Hernia repair    . Wisdom tooth extraction    . Cesarean section       x 3   Family History:  Family History  Problem Relation Age of Onset  . Cancer Mother     Abdominal cancer  . Heart Problems Mother   . Diabetes Father   . Hypertension Father   . Asthma Sister   . Arthritis Sister   . Heart disease Mother   . Heart disease Sister   . CVA Maternal Aunt   . Diabetes Sister    Family Psychiatric  History: Patient  reports that one of her daughters who is 29 year old has autism. Her 47 year old has been hospitalized psychiatrically twice due to suicidality and depression. The patient has a great uncle with bipolar and drug addiction.  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 Patient is divorced and lives with her 21 year old daughter with autism. The patient's total of 3 children ages 64, 48 and 2. The 76 and a 47 year old live with their father in [place]. Patient reports having Medicaid. History  Alcohol Use No     History  Drug Use No     Allergies:  No Known Allergies   Lab Results:  Results for orders placed or performed during the hospital encounter of 08/24/15 (from the past 48 hour(s))  Comprehensive metabolic panel     Status: None   Collection Time: 08/24/15  5:22 PM  Result Value Ref Range  Sodium 138 135 - 145 mmol/L   Potassium 3.8 3.5 - 5.1 mmol/L   Chloride 103 101 - 111 mmol/L   CO2 24 22 - 32 mmol/L   Glucose, Bld 84 65 - 99 mg/dL   BUN 13 6 - 20 mg/dL   Creatinine, Ser 0.47 0.44 - 1.00 mg/dL   Calcium 9.6 8.9 - 10.3 mg/dL   Total Protein 8.1 6.5 - 8.1 g/dL   Albumin 4.0 3.5 - 5.0 g/dL   AST 29 15 - 41 U/L   ALT 26 14 - 54 U/L   Alkaline Phosphatase 121 38 - 126 U/L   Total Bilirubin 0.4 0.3 - 1.2 mg/dL   GFR calc non Af Amer >60 >60 mL/min   GFR calc Af Amer >60 >60 mL/min    Comment: (NOTE) The eGFR has been calculated using the CKD EPI equation. This calculation has not been validated in all clinical situations. eGFR's persistently <60 mL/min signify possible Chronic Kidney Disease.    Anion gap 11 5 - 15  Ethanol (ETOH)     Status: None   Collection Time: 08/24/15  5:22 PM  Result Value Ref Range   Alcohol, Ethyl (B) <5 <5 mg/dL    Comment:        LOWEST DETECTABLE LIMIT FOR SERUM ALCOHOL IS 5 mg/dL FOR MEDICAL PURPOSES ONLY   Salicylate level     Status: None   Collection Time: 08/24/15  5:22 PM  Result Value Ref Range   Salicylate  Lvl <0.4 2.8 - 30.0 mg/dL  Acetaminophen level     Status: Abnormal   Collection Time: 08/24/15  5:22 PM  Result Value Ref Range   Acetaminophen (Tylenol), Serum <10 (L) 10 - 30 ug/mL    Comment:        THERAPEUTIC CONCENTRATIONS VARY SIGNIFICANTLY. A RANGE OF 10-30 ug/mL MAY BE AN EFFECTIVE CONCENTRATION FOR MANY PATIENTS. HOWEVER, SOME ARE BEST TREATED AT CONCENTRATIONS OUTSIDE THIS RANGE. ACETAMINOPHEN CONCENTRATIONS >150 ug/mL AT 4 HOURS AFTER INGESTION AND >50 ug/mL AT 12 HOURS AFTER INGESTION ARE OFTEN ASSOCIATED WITH TOXIC REACTIONS.   CBC     Status: None   Collection Time: 08/24/15  5:22 PM  Result Value Ref Range   WBC 5.7 4.0 - 10.5 K/uL   RBC 4.97 3.87 - 5.11 MIL/uL   Hemoglobin 14.7 12.0 - 15.0 g/dL   HCT 42.5 36.0 - 46.0 %   MCV 85.5 78.0 - 100.0 fL   MCH 29.6 26.0 - 34.0 pg   MCHC 34.6 30.0 - 36.0 g/dL   RDW 13.4 11.5 - 15.5 %   Platelets 274 150 - 400 K/uL  Urine rapid drug screen (hosp performed) (Not at [facility])     Status: None   Collection Time: 08/24/15  6:53 PM  Result Value Ref Range   Opiates NONE DETECTED NONE DETECTED   Cocaine NONE DETECTED NONE DETECTED   Benzodiazepines NONE DETECTED NONE DETECTED   Amphetamines NONE DETECTED NONE DETECTED   Tetrahydrocannabinol NONE DETECTED NONE DETECTED   Barbiturates NONE DETECTED NONE DETECTED    Comment:        DRUG SCREEN FOR MEDICAL PURPOSES ONLY.  IF CONFIRMATION IS NEEDED FOR ANY PURPOSE, NOTIFY LAB WITHIN 5 DAYS.        LOWEST DETECTABLE LIMITS FOR URINE DRUG SCREEN Drug Class       Cutoff (ng/mL) Amphetamine      1000 Barbiturate      200 Benzodiazepine   799 Tricyclics  300 Opiates          300 Cocaine          300 THC              50     Blood Alcohol level:  Lab Results  Component Value Date   ETH <5 08/24/2015   ETH <11 17/61/6073    Metabolic Disorder Labs:  No results found for: HGBA1C, MPG Lab Results  Component Value Date   PROLACTIN 12.2 10/14/2011   No  results found for: CHOL, TRIG, HDL, CHOLHDL, VLDL, LDLCALC  Current Medications: Current Facility-Administered Medications  Medication Dose Route Frequency Provider Last Rate Last Dose  . acetaminophen (TYLENOL) tablet 650 mg  650 mg Oral Q6H PRN Hildred Priest, MD      . alum & mag hydroxide-simeth (MAALOX/MYLANTA) 200-200-20 MG/5ML suspension 30 mL  30 mL Oral Q4H PRN Hildred Priest, MD      . FLUoxetine (PROZAC) capsule 60 mg  60 mg Oral Daily Hildred Priest, MD   60 mg at 08/26/15 0943  . LORazepam (ATIVAN) tablet 0.25 mg  0.25 mg Oral TID PRN Hildred Priest, MD      . magnesium hydroxide (MILK OF MAGNESIA) suspension 30 mL  30 mL Oral Daily PRN Hildred Priest, MD      . mirtazapine (REMERON) tablet 15 mg  15 mg Oral QHS Hildred Priest, MD      . Vitamin D (Ergocalciferol) (DRISDOL) capsule 50,000 Units  50,000 Units Oral Q7 days Hildred Priest, MD   50,000 Units at 08/26/15 7106   PTA Medications: Prescriptions prior to admission  Medication Sig Dispense Refill Last Dose  . FLUoxetine (PROZAC) 40 MG capsule Take 40 mg by mouth daily.   08/23/2015 at Unknown time  . LORazepam (ATIVAN) 0.5 MG tablet Take 0.5 mg by mouth every 8 (eight) hours as needed for anxiety.   08/23/2015 at Unknown time  . norethindrone (AYGESTIN) 5 MG tablet Take 1 tablet (5 mg total) by mouth 2 (two) times daily. (Patient not taking: Reported on 08/24/2015) 20 tablet 0 Completed Course at Unknown time  . oxyCODONE-acetaminophen (ROXICET) 5-325 MG tablet Take 1 tablet by mouth every 4 (four) hours as needed for severe pain. (Patient not taking: Reported on 08/24/2015) 20 tablet 0 Completed Course at Unknown time  . Vitamin D, Ergocalciferol, (DRISDOL) 50000 units CAPS capsule Take 50,000 Units by mouth every 7 (seven) days.   08/18/2015 at unknown time    Musculoskeletal: Strength & Muscle Tone: within normal limits Gait & Station:  normal Patient leans: N/A  Psychiatric Specialty Exam: Physical Exam  Constitutional: She is oriented to person, place, and time. She appears well-developed and well-nourished.  HENT:  Head: Normocephalic and atraumatic.  Eyes: Conjunctivae and EOM are normal.  Neck: Normal range of motion.  Respiratory: Effort normal.  Musculoskeletal: Normal range of motion.  Neurological: She is alert and oriented to person, place, and time.    Review of Systems  HENT: Negative.   Eyes: Negative.   Respiratory: Negative.   Cardiovascular: Negative.   Gastrointestinal: Negative.   Genitourinary: Negative.   Musculoskeletal: Negative.   Skin: Negative.   Neurological: Positive for weakness.  Endo/Heme/Allergies: Negative.   Psychiatric/Behavioral: Positive for depression. Negative for suicidal ideas and hallucinations. The patient is nervous/anxious and has insomnia.     Blood pressure 116/73, pulse 66, temperature 98.6 F (37 C), temperature source Oral, resp. rate 20, height 5' (1.524 m), weight 78.926 kg (174 lb), last menstrual  period 10/04/2013, SpO2 99 %.Body mass index is 33.98 kg/(m^2).  General Appearance: Fairly Groomed  Engineer, water::  Fair  Speech:  Clear and Coherent  Volume:  Normal  Mood:  Dysphoric  Affect:  Blunt  Thought Process:  Linear  Orientation:  Full (Time, Place, and Person)  Thought Content:  Hallucinations: None  Suicidal Thoughts:  No  Homicidal Thoughts:  No  Memory:  Immediate;   Good Recent;   Good Remote;   Good  Judgement:  Good  Insight:  Good  Psychomotor Activity:  Decreased  Concentration:  Good  Recall:  Good  Fund of Knowledge:Good  Language: Good  Akathisia:  No  Handed:    AIMS (if indicated):     Assets:  Armed forces logistics/support/administrative officer Physical Health  ADL's:  Intact  Cognition: WNL  Sleep:  Number of Hours: 7    Treatment Plan Summary:   Major depressive disorder the patient feels that Prozac has been beneficial for her and she wants to  continue this medication I will increase the Prozac from 40 mg to 60 mg a day. I will discontinue Wellbutrin as the patient had a prior history of possible seizures. I will augment the fluoxetine with mirtazapine 15 mg by mouth daily at bedtime  Insomnia patient will be restarted on mirtazapine 15 mg by mouth daily at bedtime  Anxiety the patient will be continue on Ativan 0.25 mg 3 times a day as needed for anxiety  Vitamin D deficiency patient will be continued 50,000 units weekly for vitamin D  Hospitalization and status voluntary  Diet regular  Precautions every 15 minute checks  Follow-up patient will continue to follow-up with her outpatient psychiatrist  Disposition patient will return to her home once stable  Laboratory I will order TSH and vitamin B12.   I certify that inpatient services furnished can reasonably be expected to improve the patient's condition.    Hildred Priest, MD 4/22/201712:35 PM

## 2015-08-26 NOTE — Plan of Care (Signed)
Problem: Alteration in mood Goal: LTG-Patient reports reduction in suicidal thoughts (Patient reports reduction in suicidal thoughts and is able to verbalize a safety plan for whenever patient is feeling suicidal)  Outcome: Progressing Pt denies SI     

## 2015-08-26 NOTE — Progress Notes (Signed)
D: Observed pt in room reading. Patient alert and oriented x4. Patient denies SI/HI/AVH. Pt affect is depressed. Pt stater her mood was "good...not too bad." Pt talked briefly about 2 recent deaths in her family leading to current hospitalization. Pt indicated that she had laid in bed all day on Thursday. Pt rated depression 5/10 and anxiety 8/10. Pt indicated increased anxiety was due to hectic situation on the unit.  A: Offered active listening and support. Provided therapeutic communication. Administered scheduled medications. Encouraged pt to attend group and actively participate in care. Administered vistaril prn. R: Pt pleasant and cooperative. Pt medication compliant. Will continue Q15 min. checks. Safety maintained.

## 2015-08-26 NOTE — Progress Notes (Addendum)
Pt has been active on the unit but not verbally interacting with any one. Pt denies SI and A/V hallucinations. Pt's mood and affect has been depressed. Pt became upset following a phone call. Pt stated she got upset when talking to her sister about events leading to hospitalization. Pt requested some prns for anxiety. Pt was given ativan 0.25mg s po at 1256hrs.

## 2015-08-27 NOTE — Progress Notes (Signed)
Pt has been active on the unit but not verbally interacting with any one. Pt denies SI and A/V hallucinations. Pt's mood and affect has been depressed. Pt stated she feels better today and slept well last night. Pt continues to attend all unit activities.

## 2015-08-27 NOTE — [facility] Group Notes (Signed)
[facility] LCSW Group Therapy  08/27/2015 4:54 PM  Type of Therapy:  Group Therapy  Participation Level:  Active  Participation Quality:  Attentive  Affect:  Appropriate  Cognitive:  Alert  Insight:  Developing/Improving  Engagement in Therapy:  Developing/Improving  Modes of Intervention:  Discussion, Education, Exploration and Support  Summary of Progress/Problems: LCSW created a supportive group environment and reviewed group rules with patients. Topic for today was emotional regulation and LCSW requested full group participation and patient were to identify when their emotions were regulated and when they are not. LCSW using peers experiences to enlist ideas to assist others. A very supportive discussion ensued and this patient supported her peers.   Jane Munoz, Jane Munoz 08/27/2015, 4:54 PM

## 2015-08-27 NOTE — Plan of Care (Signed)
Problem: [facility] Outcome: Progressing Pt able to verbalize understanding of the general behavioral health treatment plan of care.

## 2015-08-27 NOTE — Progress Notes (Signed)
[facility] MD Progress Note  08/27/2015 10:01 AM Jane Munoz  MRN:  [id number] Subjective:  Patient reports feeling somewhat better today. She tells me she was able to sleep very well last night. Today she still feels very tired and drained. She denies feeling as hopeless as she was before but she is is still depressed. She denies suicidality, homicidality or having auditory or visual hallucinations. Again concentration and energy are low, appetite is increased and his sleep is much improved. She denies any side effects from her medications. She denies having any physical complaints.  Per nursing: D: Denies SI/AVH. Affect depressed. Minimal interaction with staff or peers. Isolative. Slow motor activity. Denies pain.  Medication compliant.   Principal Problem: Severe major depression, single episode, without psychotic features (HCC) Diagnosis:   Patient Active Problem List   Diagnosis Date Noted  . Severe major depression, single episode, without psychotic features (HCC) [F32.2] 08/25/2015  . GAD (generalized anxiety disorder) [F41.1] 08/25/2015   Total Time spent with patient: 30 minutes   Past Medical History:  Past Medical History  Diagnosis Date  . Anxiety and depression   . Vertigo     chronic, s/p eval with cardiology, neurology and ENT  . H/O varicella   . Blood transfusion   . H/O blood clots     DVT with pregnancy  . H/O bladder infections   . Leaking of urine   . Preterm labor   . H/O dysmenorrhea 05/08/11  . Adenomyosis 05/08/11  . Pelvic pain 1/2/`13  . Heart murmur   . Colon polyps   . Elevated d-dimer- 1.29 on Oct 27th 2014- CTA neg for PE, LE venous dopplers neg for DVT 03/10/13 03/06/2013    Past Surgical History  Procedure Laterality Date  . Hernia repair    . Wisdom tooth extraction    . Cesarean section       x 3   Family History:  Family History  Problem Relation Age of Onset  . Cancer Mother     Abdominal cancer  . Heart Problems Mother   . Diabetes  Father   . Hypertension Father   . Asthma Sister   . Arthritis Sister   . Heart disease Mother   . Heart disease Sister   . CVA Maternal Aunt   . Diabetes Sister     Social History:  History  Alcohol Use No     History  Drug Use No    Social History   Social History  . Marital Status: Divorced    Spouse Name: N/A  . Number of Children: 3  . Years of Education: Grad   Occupational History  . TEACHER    Social History Main Topics  . Smoking status: Never Smoker   . Smokeless tobacco: Never Used  . Alcohol Use: No  . Drug Use: No  . Sexual Activity: Not Asked   Other Topics Concern  . 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 acetaminophen (TYLENOL) tablet 650 mg  650 mg Oral Q6H PRN  Jimmy FootmanAndrea Hernandez-Gonzalez, MD      . alum & mag hydroxide-simeth (MAALOX/MYLANTA) 200-200-20 MG/5ML suspension 30 mL  30 mL Oral Q4H PRN Jimmy FootmanAndrea Hernandez-Gonzalez, MD      . FLUoxetine (PROZAC) capsule 60 mg  60 mg Oral Daily Jimmy FootmanAndrea Hernandez-Gonzalez, MD   60 mg at 08/27/15 0815  . LORazepam (ATIVAN) tablet 0.25 mg  0.25 mg Oral TID PRN Jimmy FootmanAndrea Hernandez-Gonzalez, MD   0.25 mg at 08/26/15 1256  . magnesium hydroxide (MILK OF MAGNESIA) suspension 30 mL  30 mL Oral Daily PRN Jimmy FootmanAndrea Hernandez-Gonzalez, MD      . mirtazapine (REMERON) tablet 15 mg  15 mg Oral QHS Jimmy FootmanAndrea Hernandez-Gonzalez, MD   15 mg at 08/26/15 2221  . Vitamin D (Ergocalciferol) (DRISDOL) capsule 50,000 Units  50,000 Units Oral Q7 days Jimmy FootmanAndrea Hernandez-Gonzalez, MD   50,000 Units at 08/26/15 [id number]    Lab Results:  Results  for orders placed or performed during the hospital encounter of 08/25/15 (from the past 48 hour(s))  Vitamin B12     Status: None   Collection Time: 08/26/15 11:46 AM  Result Value Ref Range   Vitamin B-12 470 180 - 914 pg/mL    Comment: (NOTE) This assay is not validated for testing neonatal or myeloproliferative syndrome specimens for Vitamin B12 levels. Performed at [facility]   TSH     Status: None   Collection Time: 08/26/15 11:46 AM  Result Value Ref Range   TSH 2.524 0.350 - 4.500 uIU/mL  Urinalysis complete, with microscopic ([facility] only)     Status: Abnormal   Collection Time: 08/26/15  5:59 PM  Result Value Ref Range   Color, Urine STRAW (A) YELLOW   APPearance CLEAR (A) CLEAR   Glucose, UA NEGATIVE NEGATIVE mg/dL   Bilirubin Urine NEGATIVE NEGATIVE   Ketones, ur NEGATIVE NEGATIVE mg/dL   Specific Gravity, Urine 1.005 1.005 - 1.030   Hgb urine dipstick NEGATIVE NEGATIVE   pH 6.0 5.0 - 8.0   Protein, ur NEGATIVE NEGATIVE mg/dL   Nitrite NEGATIVE NEGATIVE   Leukocytes, UA NEGATIVE NEGATIVE   RBC / HPF 0-5 0 - 5 RBC/hpf   WBC, UA 0-5 0 - 5 WBC/hpf   Bacteria, UA NONE SEEN NONE SEEN   Squamous Epithelial / LPF 0-5 (A) NONE SEEN    Blood Alcohol level:  Lab Results  Component Value Date   [facility]   ETH <11 06/04/2013    Physical Findings: AIMS:  , ,  ,  , Dental Status Current problems with teeth and/or dentures?: No Does patient usually wear dentures?: No  CIWA:    COWS:     Musculoskeletal: Strength & Muscle Tone: within normal limits Gait & Station: normal Patient leans: N/A  Psychiatric Specialty Exam: Review of Systems  Constitutional: Negative.   HENT: Negative.   Eyes: Negative.   Respiratory: Negative.   Cardiovascular: Negative.   Gastrointestinal: Negative.   Genitourinary: Negative.   Musculoskeletal: Negative.   Skin: Negative.   Neurological: Negative.   Endo/Heme/Allergies: Negative.   Psychiatric/Behavioral:  Positive for depression. Negative for suicidal ideas, hallucinations, memory loss and substance abuse. The patient is nervous/anxious. The patient does not have insomnia.     Blood pressure 123/72, pulse 62, temperature 97.8 F (36.6 C), temperature source Oral, resp. rate 20, height 5' (1.524 m), weight 78.926 kg (174 lb), last menstrual period 10/04/2013, SpO2 99 %.Body mass index is 33.98 kg/(m^2).  General Appearance: Well Groomed  Eye Contact::  Good  Speech:  Clear and Coherent  Volume:  Normal  Mood:  Dysphoric  Affect:  Blunt  Thought Process:  Linear  Orientation:  Full (Time, Place, and Person)  Thought Content:  Hallucinations: None  Suicidal Thoughts:  No  Homicidal Thoughts:  No  Memory:  Immediate;   Good Recent;   Good Remote;   Good  Judgement:  Fair  Insight:  Fair  Psychomotor Activity:  Decreased  Concentration:  Good  Recall:  Good  Fund of Knowledge:Good  Language: Good  Akathisia:  No  Handed:    AIMS (if indicated):     Assets:  Manufacturing systems engineer Physical Health  ADL's:  Intact  Cognition: WNL  Sleep:  Number of Hours: 7.15   Treatment Plan Summary: Daily contact with patient to assess and evaluate symptoms and progress in treatment and Medication management   Major depressive disorder the patient feels that Prozac has been beneficial for her and she wants to continue this medication I will increase the Prozac from 40 mg to 60 mg a day. I will discontinue Wellbutrin as the patient had a prior history of possible seizures. I will augment the fluoxetine with mirtazapine 15 mg by mouth daily at bedtime  Insomnia patient will be restarted on mirtazapine 15 mg by mouth daily at bedtime. Plan to increase to 30 mg in the next few days.  Anxiety the patient will be continue on Ativan 0.25 mg 3 times a day as needed for anxiety  Vitamin D deficiency patient will be continued 50,000 units weekly for vitamin D  Hospitalization and status voluntary  Diet  regular  Precautions every 15 minute checks  Follow-up patient will continue to follow-up with her outpatient psychiatrist  Disposition patient will return to her home once stable  Laboratory I will order TSH and vitamin B12.---both wnl   I certify that inpatient services furnished can reasonably be expected to improve the patient's condition.   Jimmy Footman, MD 08/27/2015, 10:01 AM

## 2015-08-27 NOTE — [facility] Counselor (Signed)
Adult Comprehensive Assessment  Patient ID: Jane Munoz, female   DOB: [birth year], 45 y.o.   MRN: [id number]  Information Source: Information source: Patient  Current Stressors:  Educational / Learning stressors: Pt current in PhD program. She planned to graduate this semester but it did not happen.  Employment / Job issues: Currently working part time at [employer]  Family Relationships: Family is spread throughout [place] and [place]. No family close by. Caregiver for daughter with severe Autism.  Financial / Lack of resources (include bankruptcy): Limited income  Housing / Lack of housing: [place] lives in [place] with [place]  Physical health (include injuries & life threatening diseases): None reported  Social relationships: None reported  Substance abuse: None reported  Bereavement / Loss: Two family members passed away recently.   Living/Environment/Situation:  Living Arrangements: Children Living conditions (as described by patient or guardian): Pt lives with daughter who has severe Autism  How long has patient lived in current situation?: 5 years  What is atmosphere in current home: Comfortable  Family History:  Marital status: Divorced Divorced, when?: 8 years  What types of issues is patient dealing with in the relationship?: "He fell out of love. It wasn't my choice. I fought to save my family."  Additional relationship information: Pt's son and daughter live with her husband in [place]  Are you sexually active?: No What is your sexual orientation?: Heterosexual  Has your sexual activity been affected by drugs, alcohol, medication, or emotional stress?: None reported  Does patient have children?: Yes How many children?: 3 How is patient's relationship with their children?: 47 year old son and 47 year old daughter who lives with their father in [place]. 47 year old daughter with Autism lives with her.   Childhood History:  By whom was/is the patient raised?: Both  parents Description of patient's relationship with caregiver when they were a child: Good relationship with parents  Patient's description of current relationship with people who raised him/her: Mother passed away in 2004. Father is very old and lives in [place]. She is worried he will die before she earns her PhD.  How were you disciplined when you got in trouble as a child/adolescent?: None reported  Does patient have siblings?: Yes Number of Siblings: 6 Description of patient's current relationship with siblings: 1 brother passed away. 1 brother and 4 sisters who lives around the [place] and [place]. Speaks to them often.  Did patient suffer any verbal/emotional/physical/sexual abuse as a child?: No Did patient suffer from severe childhood neglect?: No Has patient ever been sexually abused/assaulted/raped as an adolescent or adult?: No Was the patient ever a victim of a crime or a disaster?: No Witnessed domestic violence?: No Has patient been effected by domestic violence as an adult?: No  Education:  Highest grade of school patient has completed: OptometristGraduate degrees,  Currently a student?: Yes Name of school: [employer]  How long has the patient attended?: 5 years  Learning disability?: No  Employment/Work Situation:   Employment situation: Employed 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, [employer] part time  How long has patient been employed?: 2 weeks  Patient's job has been impacted by current illness: No What is the longest time patient has a held a job?: 4 years  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  Has patient ever been in the [employer] and Companymilitary?: No  Financial Resources:   Financial resources: Income from employment, Medicaid (Child support ) Does patient have a representative payee or guardian?: No  Alcohol/Substance Abuse:  What has been your use of drugs/alcohol within the last 12 months?: Denies use  If attempted suicide, did drugs/alcohol play a role in this?:  No Alcohol/Substance Abuse Treatment Hx: Denies past history Has alcohol/substance abuse ever caused legal problems?: No  Social Support System:   Patient's Community Support System: Fair Describe Community Support System: Family but they live far away. Friends  Type of faith/religion: Islam  How does patient's faith help to cope with current illness?: comfort   Leisure/Recreation:   Leisure and Hobbies: "none right now'   Strengths/Needs:   What things does the patient do well?: "I am good at numbers"  In what areas does patient struggle / problems for patient: grief, worried about father passing away, care giver for daughter and school   Discharge Plan:   Does patient have access to transportation?: Yes Will patient be returning to same living situation after discharge?: Yes Currently receiving community mental health services: Yes (From Whom) ([facility] ) Does patient have financial barriers related to discharge medications?: No  Summary/Recommendations:    Patient is a 45 year old female admitted  with a diagnosis of Major Depression. Patient presented to the hospital with depression. Patient reports primary triggers for admission was grief. Patient will benefit from crisis stabilization, medication evaluation, group therapy and psycho education in addition to case management for discharge. At discharge, it is recommended that patient remain compliant with established discharge plan and continued treatment.   [employer] MSW, LCSWA . 08/27/2015

## 2015-08-27 NOTE — Progress Notes (Signed)
D: Denies SI/AVH. Affect depressed. Minimal interaction with staff or peers. Isolative. Slow motor activity. Denies pain.  Medication compliant. A: Encouragement and support provided. Medications given as prescribed. Q15  Minute checks maintained for safety.  R: Remains safe on unit. Voices no other concerns at this time.

## 2015-08-27 NOTE — [facility] Group Notes (Signed)
[facility] LCSW Group Therapy  08/27/2015 7:37 AM  Type of Therapy:  Group Therapy  Participation Level:  Active  Participation Quality:  Attentive  Affect:  Appropriate  Cognitive:  Appropriate  Insight:  Engaged  Engagement in Therapy:  Improving  Modes of Intervention:  Discussion, Exploration and Problem-solving  Summary of Progress/Problems: LCSW conducted group therapy outdoors and reviewed group rules. Each group member was asked to introduce themselves and discuss an obstacle they face when it comes to their illness and how they overcome those obstacles. Group members supported each other during this process. LCSW provided therapeutic and motivational questioning and humour. This patient reported her obstacle as being overly stressed with educational pursuits.  Arrie SenateBandi, Taraoluwa Thakur M 08/27/2015, 7:37 AM

## 2015-08-28 NOTE — Progress Notes (Signed)
Recreation Therapy Notes  Date: 04.24.17 Time: 1:00 pm Location: Craft Room  Group Topic: Self-expression  Goal Area(s) Addresses:  Patient will identify one color per emotion listed on wheel. Patient will verbalize benefit of using art as a means of self-expression. Patient will verbalize one emotion experienced during group. Patient will be educated on other forms of self-expression.  Behavioral Response: Attentive, Interactive  Intervention: Emotion Wheel  Activity: Patients were given an Arboriculturistmotion Wheel worksheet and instructed to pick a color for each emotion listed on the wheel.  Education: LRT educated patients on other forms of self-expression.  Education Outcome: Acknowledges education/In group clarification offered  Clinical Observations/Feedback: Patient completed activity by picking a color for each emotion. Patient contributed to group discussion by stating some of the colors she chose and why she chose those colors, and what emotion she felt while she was coloring.  [name],Lexxie Winberg M, LRT/CTRS 08/28/2015 2:38 PM

## 2015-08-28 NOTE — Progress Notes (Signed)
Pt is visible in milieu, socializes well, euthymic mood and effect; c/o bloated stomach, PRN Maalox given with full relief. No further concerns voiced, will continue to monitor for safety.

## 2015-08-28 NOTE — Progress Notes (Signed)
[facility] MD Progress Note  08/28/2015 10:11 AM Jane Munoz  MRN:  [id number] Subjective: Patient was in her room sleeping. She was difficult to arouse during assessment. Patient says she is feeling extremely tired and sedated this morning. She also reported a multitude of somatic complaints. She stated that she was having no pain on the right side. He reported having numbness on the left leg. This patient has a prior history of significant somatization. She reported that after her brother's death a few years ago she suffered from "convulsions". 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 The patient reports undergoing significant workup that did not reveal any abnormalities.  UA collected on Saturday was negative for UTI.  All labs are within the normal limits.  Vitamin B12 and TSH are within the normal limits.  Per nursing: Pt is visible in milieu, socializes well, euthymic mood and effect; c/o bloated stomach, PRN Maalox given with full relief. No further concerns voiced, will continue to monitor for safety.    Principal Problem: Severe major depression, single episode, without psychotic features (HCC) Diagnosis:   Patient Active Problem List   Diagnosis Date Noted  . Severe major depression, single episode, without psychotic features (HCC) [F32.2] 08/25/2015  . GAD (generalized anxiety disorder) [F41.1] 08/25/2015   Total Time spent with patient: 30 minutes   Past Medical History:  Past Medical History  Diagnosis Date  . Anxiety and depression   . Vertigo     chronic, s/p eval with cardiology, neurology and ENT  . H/O varicella   . Blood transfusion   . H/O blood clots     DVT with pregnancy  . H/O bladder infections   . Leaking of urine   . Preterm labor   . H/O dysmenorrhea 05/08/11  . Adenomyosis 05/08/11  . Pelvic pain 1/2/`13  . Heart murmur   . Colon polyps   . Elevated d-dimer- 1.29 on Oct 27th 2014-  CTA neg for PE, LE venous dopplers neg for DVT 03/10/13 03/06/2013    Past Surgical History  Procedure Laterality Date  . Hernia repair    . Wisdom tooth extraction    . Cesarean section       x 3   Family History:  Family History  Problem Relation Age of Onset  . Cancer Mother     Abdominal cancer  . Heart Problems Mother   . Diabetes Father   . Hypertension Father   . Asthma Sister   . Arthritis Sister   . Heart disease Mother   . Heart disease Sister   . CVA Maternal Aunt   . Diabetes Sister     Social History:  History  Alcohol Use No     History  Drug Use No    Social History   Social History  . Marital Status: Divorced    Spouse Name: N/A  . Number of Children: 3  . Years of Education: Grad   Occupational History  . TEACHER    Social History Main Topics  . Smoking status: Never Smoker   . Smokeless tobacco: Never Used  . Alcohol Use: No  . Drug Use: No  . Sexual Activity: Not Asked   Other Topics Concern  . 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 acetaminophen (TYLENOL) tablet 650 mg  650 mg Oral Q6H PRN Jimmy FootmanAndrea Hernandez-Gonzalez, MD      . alum & mag hydroxide-simeth (MAALOX/MYLANTA) 200-200-20 MG/5ML suspension 30 mL  30 mL Oral Q4H PRN Jimmy FootmanAndrea Hernandez-Gonzalez, MD   30 mL at 08/27/15 2109  . FLUoxetine (PROZAC) capsule 60 mg  60 mg Oral Daily Jimmy FootmanAndrea Hernandez-Gonzalez, MD   60 mg at 08/28/15 0940  . LORazepam (ATIVAN) tablet 0.25 mg  0.25 mg Oral TID PRN Jimmy FootmanAndrea Hernandez-Gonzalez, MD   0.25 mg at 08/26/15 1256  . magnesium hydroxide (MILK OF  MAGNESIA) suspension 30 mL  30 mL Oral Daily PRN Jimmy FootmanAndrea Hernandez-Gonzalez, MD      . Vitamin D (Ergocalciferol) (DRISDOL) capsule 50,000 Units  50,000 Units Oral Q7 days Jimmy FootmanAndrea Hernandez-Gonzalez, MD   50,000 Units at 08/26/15 [id number]    Lab Results:  Results for orders placed or performed during the hospital encounter of 08/25/15 (from the past 48 hour(s))  Vitamin B12     Status: None   Collection Time: 08/26/15 11:46 AM  Result Value Ref Range   Vitamin B-12 470 180 - 914 pg/mL    Comment: (NOTE) This assay is not validated for testing neonatal or myeloproliferative syndrome specimens for Vitamin B12 levels. [facility]   TSH     Status: None   Collection Time: 08/26/15 11:46 AM  Result Value Ref Range   TSH 2.524 0.350 - 4.500 uIU/mL  Urinalysis complete, with microscopic ([facility] only)     Status: Abnormal   Collection Time: 08/26/15  5:59 PM  Result Value Ref Range   Color, Urine STRAW (A) YELLOW   APPearance CLEAR (A) CLEAR   Glucose, UA NEGATIVE NEGATIVE mg/dL   Bilirubin Urine NEGATIVE NEGATIVE   Ketones, ur NEGATIVE NEGATIVE mg/dL   Specific Gravity, Urine 1.005 1.005 - 1.030   Hgb urine dipstick NEGATIVE NEGATIVE   pH 6.0 5.0 - 8.0   Protein, ur NEGATIVE NEGATIVE mg/dL   Nitrite NEGATIVE NEGATIVE   Leukocytes, UA NEGATIVE NEGATIVE   RBC / HPF 0-5 0 - 5 RBC/hpf   WBC, UA 0-5 0 - 5 WBC/hpf   Bacteria, UA NONE SEEN NONE SEEN   Squamous Epithelial / LPF 0-5 (A) NONE SEEN    Blood Alcohol level:  Lab Results  Component [facility]   [facility]   ETH <11 06/04/2013    Physical Findings: AIMS:  , ,  ,  , Dental Status Current problems with teeth and/or dentures?: No Does patient usually wear dentures?: No  CIWA:    COWS:     Musculoskeletal: Strength & Muscle Tone: within normal limits Gait & Station: normal Patient leans: N/A  Psychiatric Specialty Exam: Review of Systems  Constitutional: Negative.   HENT: Negative.   Eyes:  Negative.   Respiratory: Negative.   Cardiovascular: Negative.   Gastrointestinal: Negative.   Genitourinary: Negative.   Musculoskeletal: Negative.   Skin: Negative.   Neurological: Negative.   Endo/Heme/Allergies: Negative.   Psychiatric/Behavioral: Positive for depression. Negative for suicidal ideas, hallucinations, memory loss and substance abuse. The patient is nervous/anxious. The patient does not have insomnia.     Blood pressure 124/75, pulse 62,  temperature 98.8 F (37.1 C), temperature source Oral, resp. rate 18, height 5' (1.524 m), weight 78.926 kg (174 lb), last menstrual period 10/04/2013, SpO2 99 %.Body mass index is 33.98 kg/(m^2).  General Appearance: Well Groomed  Patent attorney::  Good  Speech:  Clear and Coherent  Volume:  Normal  Mood:  Dysphoric  Affect:  Blunt  Thought Process:  Linear  Orientation:  Full (Time, Place, and Person)  Thought Content:  Hallucinations: None  Suicidal Thoughts:  No  Homicidal Thoughts:  No  Memory:  Immediate;   Good Recent;   Good Remote;   Good  Judgement:  Fair  Insight:  Fair  Psychomotor Activity:  Decreased  Concentration:  Good  Recall:  Good  Fund of Knowledge:Good  Language: Good  Akathisia:  No  Handed:    AIMS (if indicated):     Assets:  Manufacturing systems engineer Physical Health  ADL's:  Intact  Cognition: WNL  Sleep:  Number of Hours: 7.15   Treatment Plan Summary: Daily contact with patient to assess and evaluate symptoms and progress in treatment and Medication management   Major depressive disorder the patient feels that Prozac has been beneficial for her and she wants to continue this medication I will increase the Prozac from 40 mg to 60 mg a day. I will discontinue Wellbutrin as the patient had a prior history of possible seizures. I will augment the fluoxetine with mirtazapine 15 mg by mouth daily at bedtime  Insomnia patient was started on mirtazapine 15 mg by mouth daily at bedtime however she appears  to be overly sedated with this medication and therefore we will discontinue today  Anxiety the patient will be continue on Ativan 0.25 mg 3 times a day as needed for anxiety  Vitamin D deficiency patient will be continued 50,000 units weekly for vitamin D  Hospitalization and status voluntary  Diet regular  Precautions every 15 minute checks  Follow-up patient will continue to follow-up with her outpatient psychiatrist  Disposition patient will return to her home once stable--possible discharge in the next 24-48 hours  Laboratory I will order TSH and vitamin B12.---both wnl   I certify that inpatient services furnished can reasonably be expected to improve the patient's condition.   Jimmy Footman, MD 08/28/2015, 10:11 AM

## 2015-08-28 NOTE — Progress Notes (Signed)
Recreation Therapy Notes  At approximately 10:47 am, LRT attempted assessment. Patient sleeping and did not wake up when name was called.  Jane Munoz,Jane Munoz, LRT/CTRS 08/28/2015 11:19 AM

## 2015-08-28 NOTE — Progress Notes (Signed)
Recreation Therapy Notes  INPATIENT RECREATION THERAPY ASSESSMENT  Patient Details Name: Jane Munoz MRN: [id number] DOB: 20-Jan-1971 Today's Date: 08/28/2015  Patient Stressors: Family, Death, Work, School, Other (Comment) (One daughter with autism and ID; one daughter with mental health issues (depression and suicidal); feels son wants to bail out on her - anxious becuase he is the man in the house and her father is getting older - doesn't know what will happen to her )if her father dies and her son bails on her; 2 deaths in family; works at [employer] but feels like she doesn't get along with other employees; 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 PhD; worried dad is going to die before she graduates; finances; disappointed in self  Coping Skills:   Art/Dance, Talking, Music  Personal Challenges: Anger, Concentration, Decision-Making, Expressing Yourself, Relationships, School Performance, Self-Esteem/Confidence, Stress Management, Time Management, Work Nutritional therapisterformance  Leisure Interests (2+):  Individual - TV, Individual - Other (Comment) (Eat food)  Awareness of Community Resources:  Yes  Community Resources:  Research scientist (physical sciences)Movie Theaters, Tree surgeonMall  Current Use: Yes  If no, Barriers?:    Patient Strengths:  Helpful, kind  Patient Identified Areas of Improvement:  Time management; money management; focusing on what she needs to focus on  Current Recreation Participation:  Nothing  Patient Goal for Hospitalization:  To increase self-esteem  [place]  County of Residence:  [place]   Current [place] (including self-harm):  No  Current HI:  No  Consent to Intern Participation: N/A   [name],Inigo Lantigua M, LRT/CTRS 08/28/2015, 2:53 PM

## 2015-08-28 NOTE — Plan of Care (Signed)
Problem: Ineffective individual coping Goal: STG: Patient will remain free from self harm Outcome: Progressing Pt remains free from harm.  Problem: Alteration in mood Goal: LTG-Patient reports reduction in suicidal thoughts (Patient reports reduction in suicidal thoughts and is able to verbalize a safety plan for whenever patient is feeling suicidal)  Outcome: Progressing Pt denies SI at this time. She rates her depression 3/10.

## 2015-08-28 NOTE — Progress Notes (Signed)
Patient stated that her depression is kind of same but her thoughts are more clear now.Denies suicidal or homicidal ideations.Appropriate with staff & peers.Compliant with medications.Attended groups.Appetite and energy level good.

## 2015-08-28 NOTE — [facility] Group Notes (Signed)
[facility] Group Notes:  (Nursing/MHT/Case Management/Adjunct)  Date:  08/28/2015  Time:  4:05 PM  Type of Therapy:  Psychoeducational Skills  Participation Level:  Active  Participation Quality:  Appropriate, Attentive and Sharing  Affect:  Appropriate  Cognitive:  Alert and Appropriate  Insight:  Appropriate  Engagement in Group:  Engaged  Modes of Intervention:  Discussion, Education and Support  Summary of Progress/Problems:  Lynelle SmokeCara Travis Ellwood Steidle 08/28/2015, 4:05 PM

## 2015-08-28 NOTE — [facility] Group Notes (Signed)
[facility] Group Notes:  (Nursing/MHT/Case Management/Adjunct)  Date:  08/28/2015  Time:  9:33 PM  Type of Therapy:  Group Therapy  Participation Level:  Active  Participation Quality:  Appropriate  Affect:  Appropriate  Cognitive:  Appropriate  Insight:  Appropriate  Engagement in Group:  Engaged  Modes of Intervention:  Discussion  Summary of Progress/Problems:  Jane Munoz 08/28/2015, 9:33 PM

## 2015-08-29 MED ORDER — TRAZODONE HCL 50 MG PO TABS
50.0000 mg | ORAL_TABLET | Freq: Every day | ORAL | Status: DC
  Administered 2015-08-29 – 2015-08-31 (×3): 50 mg via ORAL
  Filled 2015-08-29 (×3): qty 1

## 2015-08-29 NOTE — Progress Notes (Signed)
D:  Patient is alert and oriented on the unit this shift.  Patient attended and actively participated in groups today.  Patient denies suicidal ideation, homicidal ideation, auditory or visual hallucinations at the present time.  Patient reports her mood is improved today.  A:  Scheduled medications are administered to patient as per MD orders.  Emotional support and encouragement are provided.  Patient is maintained on q.15 minute safety checks.  Patient is informed to notify staff with questions or concerns. R:  No adverse medication reactions are noted.  Patient is cooperative with medication administration and treatment plan today.  Patient is receptive, calm and cooperative on the unit at this time.  Patient interacts well with others on the unit this shift.  Patient contracts for safety at this time.  Patient remains safe at this time.

## 2015-08-29 NOTE — Progress Notes (Signed)
[facility] MD Progress Note  08/29/2015 2:28 PM Jane Munoz  MRN:  [id number] Subjective: Yesterday the patient reported not doing well. She is spent most of the day in bed. She had multiple somatic complaints such as right inguinal pain, and numbness on her left leg. The patient has displayed this t of somatization in the past. Today the patient says she is not as sedated as she was yesterday however she was unable to sleep at all last night.  She does not feel ready for discharge. She feels she still has a lot to think about and feels overwhelmed with her problems. She tells me that her 47 year old daughter that suffers from [place] some is very low functioning. She is still wearing adult diapers and receives nursing care from the time she leaves school until 7 PM.  Patient worries about her finances and ability to pay her bills. She says that she has been dependent on financial help from her sister who lives in [place] and she feels like a burden for them.  She also feels she is a disappointment from her father who has been asking her to complete her PhD. The patient has attempted to complete her PhD in 2 locations but she has failed.   This patient has a prior history of significant somatization. She reported that after her brother's death a few years ago she suffered from "convulsions". 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 The patient reports undergoing significant workup that did not reveal any abnormalities.   UA collected on Saturday was negative for UTI.  All labs are within the normal limits.  Vitamin B12 and TSH are within the normal limits.  Per nursing: Patient is alert and oriented on the unit this shift. Patient attended and actively participated in groups today. Patient denies suicidal ideation, homicidal ideation, auditory or visual hallucinations at the present time. Patient reports her mood is improved  today.   Principal Problem: Severe major depression, single episode, without psychotic features (HCC) Diagnosis:   Patient Active Problem List   Diagnosis Date Noted  . Severe major depression, single episode, without psychotic features (HCC) [F32.2] 08/25/2015  . GAD (generalized anxiety disorder) [F41.1] 08/25/2015   Total Time spent with patient: 30 minutes   Past Medical History:  Past Medical History  Diagnosis Date  . Anxiety and depression   . Vertigo     chronic, s/p eval with cardiology, neurology and ENT  . H/O varicella   . Blood transfusion   . H/O blood clots     DVT with pregnancy  . H/O bladder infections   . Leaking of urine   . Preterm labor   . H/O dysmenorrhea 05/08/11  . Adenomyosis 05/08/11  . Pelvic pain 1/2/`13  . Heart murmur   . Colon polyps   . Elevated d-dimer- 1.29 on Oct 27th 2014- CTA neg for PE, LE venous dopplers neg for DVT 03/10/13 03/06/2013    Past Surgical History  Procedure Laterality Date  . Hernia repair    . Wisdom tooth extraction    . Cesarean section       x 3   Family History:  Family History  Problem Relation Age of Onset  . Cancer Mother     Abdominal cancer  . Heart Problems Mother   . Diabetes Father   . Hypertension Father   . Asthma Sister   . Arthritis Sister   .  Heart disease Mother   . Heart disease Sister   . CVA Maternal Aunt   . Diabetes Sister     Social History:  History  Alcohol Use No     History  Drug Use No    Social History   Social History  . Marital Status: Divorced    Spouse Name: N/A  . Number of Children: 3  . Years of Education: Grad   Occupational History  . TEACHER    Social History Main Topics  . Smoking status: Never Smoker   . Smokeless tobacco: Never Used  . Alcohol Use: No  . Drug Use: No  . Sexual Activity: Not Asked   Other Topics Concern  . 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 acetaminophen (TYLENOL) tablet 650 mg  650 mg Oral Q6H PRN Jimmy FootmanAndrea Hernandez-Gonzalez, MD      . alum & mag hydroxide-simeth (MAALOX/MYLANTA) 200-200-20 MG/5ML suspension 30 mL  30 mL Oral Q4H PRN Jimmy FootmanAndrea Hernandez-Gonzalez, MD   30 mL at 08/27/15 2109  . FLUoxetine (PROZAC) capsule 60 mg  60 mg Oral Daily Jimmy FootmanAndrea Hernandez-Gonzalez, MD   60 mg at 08/29/15 0858  . LORazepam (ATIVAN) tablet 0.25 mg  0.25 mg Oral TID PRN Jimmy FootmanAndrea Hernandez-Gonzalez, MD   0.25 mg at 08/28/15 2250  . magnesium hydroxide (MILK OF MAGNESIA) suspension 30 mL  30 mL Oral Daily PRN Jimmy FootmanAndrea Hernandez-Gonzalez, MD      . Vitamin D (Ergocalciferol) (DRISDOL) capsule 50,000 Units  50,000 Units Oral Q7 days Jimmy FootmanAndrea Hernandez-Gonzalez, MD   50,000 Units at 08/26/15 [id number]    Lab Results:  No results found for this or any previous visit (from the past 48 hour(s)).  Blood Alcohol level:  Lab Results  Component Value Date   [facility]   ETH <11 06/04/2013    Physical Findings: AIMS:  , ,  ,  , Dental Status Current problems with teeth and/or dentures?: No Does patient usually wear dentures?: No  CIWA:    COWS:     Musculoskeletal: Strength & Muscle Tone: within normal limits Gait & Station: normal Patient leans: N/A  Psychiatric Specialty Exam: Review of Systems  Constitutional: Negative.   HENT: Negative.   Eyes: Negative.   Respiratory: Negative.   Cardiovascular: Negative.   Gastrointestinal: Negative.   Genitourinary: Negative.   Musculoskeletal: Negative.   Skin: Negative.   Neurological: Negative.   Endo/Heme/Allergies: Negative.   Psychiatric/Behavioral: Positive for  depression. Negative for suicidal ideas, hallucinations, memory loss and substance abuse. The patient is nervous/anxious and has insomnia.     Blood pressure 118/78, pulse 74, temperature 98.2 F (36.8 C), temperature source Oral, resp. rate 20, height 5' (1.524 m), weight 78.926 kg (174 lb), last menstrual period 10/04/2013, SpO2 99 %.Body mass index is 33.98 kg/(m^2).  General Appearance: Well Groomed  Patent attorneyye Contact::  Good  Speech:  Clear and Coherent  Volume:  Normal  Mood:  Dysphoric  Affect:  Blunt  Thought Process:  Linear  Orientation:  Full (Time, Place, and  Person)  Thought Content:  Hallucinations: None  Suicidal Thoughts:  No  Homicidal Thoughts:  No  Memory:  Immediate;   Good Recent;   Good Remote;   Good  Judgement:  Fair  Insight:  Fair  Psychomotor Activity:  Decreased  Concentration:  Good  Recall:  Good  Fund of Knowledge:Good  Language: Good  Akathisia:  No  Handed:    AIMS (if indicated):     Assets:  Communication Skills Physical Health  ADL's:  Intact  Cognition: WNL  Sleep:  Number of Hours: 6.5   Treatment Plan Summary: Daily contact with patient to assess and evaluate symptoms and progress in treatment and Medication management   Major depressive disorder the patient feels that Prozac has been beneficial for her and she wants to continue this medication I will increase the Prozac from 40 mg to 60 mg a day. I will discontinue Wellbutrin as the patient had a prior history of possible seizures. I will augment the fluoxetine with mirtazapine 15 mg by mouth daily at bedtime  Insomnia patient was started on mirtazapine 15 mg by mouth daily at bedtime however she appears to be overly sedated with this medication and therefore it was discontinued. Tonight the patient will be tried on trazodone 50 mg at bedtime.  Anxiety the patient will be continue on Ativan 0.25 mg 3 times a day as needed for anxiety  Vitamin D deficiency patient will be continued 50,000  units weekly for vitamin D  Hospitalization and status voluntary  Diet regular  Precautions every 15 minute checks  Follow-up patient will continue to follow-up with her outpatient psychiatrist  Disposition patient will return to her home once stable--possible discharge in the next 48 hours  Laboratory I will order TSH and vitamin B12.---both wnl   I certify that inpatient services furnished can reasonably be expected to improve the patient's condition.   Jimmy Footman, MD 08/29/2015, 2:28 PM

## 2015-08-29 NOTE — Plan of Care (Signed)
Problem: [facility] Participation in Recreation Therapeutic Interventions Goal: STG-Patient will demonstrate improved self esteem by identif STG: Self-Esteem - Within 4 treatment sessions, patient will verbalize at least 5 positive affirmation statements in each of 2 treatment sessions to increase self-esteem post d/c.  Outcome: Progressing Treatment Session 1; Completed 1 out of 2: At approximately 10:20 am, LRT met with patient in patient room. Patient verbalized 5 positive affirmation statements. When asked how it felt, patient flexed her muscles and reported it made her feel good. LRT encouraged patient to continue saying positive affirmation statements.  Leonette Monarch, LRT/CTRS 04.25.17 1:17 pm Goal: STG-Other Recreation Therapy Goal (Specify) STG: Stress Management - Within 4 treatment sessions, patient will verbalize understanding of the stress management techniques in each of 2 treatment sessions to increase stress management skills post d/c.  Outcome: Progressing Treatment Session 1; Completed 1 out of 2: At approximately 10:20 am, LRT met with patient in patient room. LRT educated and provided patient with handouts on stress management techniques. Patient verbalized understanding. LRT encouraged patient to read over and practice the stress management techniques.  Leonette Monarch, LRT/CTRS 04.25.17 1:18 pm  Problem: [facility] Participation in Recreation Therapeutic Interventions Goal: STG-Other Recreation Therapy Goal (Specify) STG: Time Management - Within 4 treatment sessions, patient will verbalize understanding of scheduling in each of 2 treatment sessions to increase time management skills post d/c.  Outcome: Progressing Treatment Session 1; Completed 1 out of 2: At approximately 10:20 am, LRT met with patient in patient room. LRT educated and provided patient with blank schedules. Patient verbalized understanding. LRT encouraged patient to use the schedules to help her manage her  time.  Leonette Monarch, LRT/CTRS 04.25.17 1:20 pm

## 2015-08-29 NOTE — [facility] Group Notes (Signed)
[facility] LCSW Group Therapy  08/29/2015 11:54 AM  Type of Therapy:  Group Therapy  Participation Level:  Active  Participation Quality:  Appropriate  Affect:  Appropriate  Cognitive:  Appropriate  Insight:  Improving  Engagement in Therapy:  Engaged  Modes of Intervention:  Activity, Discussion, Socialization and Support  Summary of Progress/Problems: Pt attended and participated in group discussion around feelings about diagnosis. Group compared feelings about diagnosis, how it impacts them, their family/community and how they will move forward in dealing with these implications at discharge. Pt responsive, believes her diagnosis will be looked down on by her family and others in her culture.  Glennon MacLaws, Marvine Encalade P, MSW, LCSW 08/29/2015, 11:54 AM

## 2015-08-29 NOTE — [facility] Group Notes (Signed)
[facility] Group Notes:  (Nursing/MHT/Case Management/Adjunct)  Date:  08/29/2015  Time:  3:54 PM  Type of Therapy:  Psychoeducational Skills  Participation Level:  Active  Participation Quality:  Appropriate, Attentive and Sharing  Affect:  Appropriate  Cognitive:  Alert and Appropriate  Insight:  Appropriate and Good  Engagement in Group:  Engaged  Modes of Intervention:  Discussion, Education and Support  Summary of Progress/Problems:  Jane SmokeCara Munoz [name] 08/29/2015, 3:54 PM

## 2015-08-29 NOTE — Progress Notes (Signed)
Recreation Therapy Notes  Date: 04.25.17 Time: 2:00 pm Location: Craft Room  Group Topic: Goal Setting  Goal Area(s) Addresses:  Patient will write at least one goal. Patient will write at least one obstacle.  Behavioral Response: Attentive, Interactive  Intervention: Recovery Goal Chart  Activity: Patients were instructed to make a Recovery Goal Chart including goals towards their recovery, obstacles, the date they started working on their goal, and the date they achieved their goal.  Education: LRT educated patients on healthy ways to celebrate reaching their goals.  Education Outcome: Acknowledges education/In group clarification offered  Clinical Observations/Feedback: Patient completed activity by writing goals and obstacles. Patient contributed to group discussion by stating ways she can keep herself focused on her goals, and healthy ways she can celebrate reaching her goals.  [name],[name], LRT/CTRS 08/29/2015 3:23 PM

## 2015-08-29 NOTE — Plan of Care (Signed)
Problem: Ineffective individual coping Goal: STG:Pt. will utilize relaxation techniques to reduce stress STG: Patient will utilize relaxation techniques to reduce stress levels  Outcome: Progressing Patient reports a lowered stress level, and reads her book in her room to relax

## 2015-08-29 NOTE — Plan of Care (Signed)
Problem: Ineffective individual coping Goal: LTG: Patient will report a decrease in negative feelings Outcome: Progressing Patient reports feeling better today than she did yesterday     

## 2015-08-29 NOTE — Progress Notes (Signed)
D: Pt is pleasant and cooperative this evening. She rates depression 3/10 and anxiety 5/10. Pt denies SI/HI/AVH at this time. Pt c/o inability to fall asleep this evening and requests PRN medication. A: Emotional support and encouragement provided. Medications administered with education. q15 minute safety checks maintained. R: Pt remains free from harm. Will continue to monitor.

## 2015-08-30 NOTE — Progress Notes (Signed)
D: Patient appears depressed. States she's here because of multiple stressors. Denies SI/HI/AVH at this time. Laid in bed for most of evening but came out for snack. Pleasant with peers and staff. Denies Pain. Only complaint is trouble sleeping.  A: Medication given for sleep with education. Encouragement provided.  R: Patient compliant with medication. She has remained calm and cooperative. Safety maintained with 15 min checks.

## 2015-08-30 NOTE — [facility] Group Notes (Signed)
[facility] LCSW Group Therapy  08/30/2015 2:45 PM  Type of Therapy:  Group Therapy  Participation Level:  Active  Participation Quality:  Appropriate  Affect:  Appropriate  Cognitive:  Appropriate  Insight:  Improving  Engagement in Therapy:  Engaged  Modes of Intervention:  Discussion, Education, Socialization and Support  Summary of Progress/Problems:Pt participated in group discussion around Emotion Regulation and how to use coping skills to change intense uncomfortable emotions, to less intense more manageable feelings that allow for better decision making.  Pt shared with the group some current stressors and some of the problems she anticipates facing once she is discharged from the hospital and how she might handle these.  Glennon MacLaws, Eleesha Purkey P, MSW, LCSW 08/30/2015, 2:45 PM

## 2015-08-30 NOTE — Progress Notes (Addendum)
[facility] MD Progress Note  08/30/2015 1:22 PM Jane Munoz  MRN:  [id number] Subjective: Today patient states that she slept well with trazodone  and is feeling more like herself, but is not ready to be discharged. She voiced concerns about her ex- husband contacting her asking when she would come get her daughter back. The patient states that this is causing her anxiety and is concerned that she is not ready to take care of her daughter yet. She feels she still has a lot to think about and feels overwhelmed with her problems. She tells me that her 47 year old daughter that suffers from autism  is very low functioning. She is still wearing adult diapers and receives nursing care from the time she leaves school until 7 PM.  Patient worries about her finances and ability to pay her bills. She says that she has been dependent on financial help from her sister who lives in [place] and she feels like a burden for them.  She also feels she is a disappointment from her father who has been asking her to complete her PhD. The patient has attempted to complete her PhD in 2 locations but she has failed.    On 4/24 pt was complaining of numbness in left leg and right inguinal pain.  Also reported UTI symptoms.  UA neg.  This patient has a prior history of significant somatization. She reported that after her brother's death a few years ago she suffered from "convulsions". 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 The patient reports undergoing significant workup that did not reveal any abnormalities.   UA collected on Saturday was negative for UTI.  All labs are within the normal limits.  Vitamin B12 and TSH are within the normal limits.  Per nursing:  D: Patient appears depressed. States she's here because of multiple stressors. Denies SI/HI/AVH at this time. Laid in bed for most of evening but came out for snack. Pleasant with peers  and staff. Denies Pain. Only complaint is trouble sleeping.  A: Medication given for sleep with education. Encouragement provided.  R: Patient compliant with medication. She has remained calm and cooperative. Safety maintained with 15 min checks.    Principal Problem: Severe major depression, single episode, without psychotic features (HCC) Diagnosis:   Patient Active Problem List   Diagnosis Date Noted  . Severe major depression, single episode, without psychotic features (HCC) [F32.2] 08/25/2015  . GAD (generalized anxiety disorder) [F41.1] 08/25/2015   Total Time spent with patient: 30 minutes   Past Medical History:  Past Medical History  Diagnosis Date  . Anxiety and depression   . Vertigo     chronic, s/p eval with cardiology, neurology and ENT  . H/O varicella   . Blood transfusion   . H/O blood clots     DVT with pregnancy  . H/O bladder infections   . Leaking of urine   . Preterm labor   . H/O dysmenorrhea 05/08/11  . Adenomyosis 05/08/11  . Pelvic pain 1/2/`13  . Heart murmur   . Colon polyps   . Elevated d-dimer- 1.29 on Oct 27th 2014- CTA neg for PE, LE venous dopplers neg for DVT 03/10/13 03/06/2013    Past Surgical History  Procedure Laterality Date  . Hernia repair    . Wisdom tooth extraction    . Cesarean section       x 3   Family History:  Family History  Problem Relation Age of Onset  . Cancer Mother     Abdominal cancer  . Heart Problems Mother   . Diabetes Father   . Hypertension Father   . Asthma Sister   . Arthritis Sister   . Heart disease Mother   . Heart disease Sister   . CVA Maternal Aunt   . Diabetes Sister     Social History:  History  Alcohol Use No     History  Drug Use No    Social History   Social History  . Marital Status: Divorced    Spouse Name: N/A  . Number of Children: 3  . Years of Education: Grad   Occupational History  . TEACHER    Social History Main Topics  . Smoking status: Never Smoker   .  Smokeless tobacco: Never Used  . Alcohol Use: No  . Drug Use: No  . Sexual Activity: Not Asked   Other Topics Concern  . 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 acetaminophen (TYLENOL) tablet 650 mg  650 mg Oral Q6H PRN Jimmy FootmanAndrea Hernandez-Gonzalez, MD      . alum & mag hydroxide-simeth (MAALOX/MYLANTA) 200-200-20 MG/5ML suspension 30 mL  30 mL Oral Q4H PRN Jimmy FootmanAndrea Hernandez-Gonzalez, MD   30 mL at 08/27/15 2109  . FLUoxetine (PROZAC) capsule 60 mg  60 mg Oral Daily Jimmy FootmanAndrea Hernandez-Gonzalez, MD   60 mg at 08/30/15 0907  . LORazepam (ATIVAN) tablet 0.25 mg  0.25 mg Oral TID PRN Jimmy FootmanAndrea Hernandez-Gonzalez, MD   0.25 mg at 08/28/15 2250  . magnesium hydroxide (MILK OF MAGNESIA) suspension 30 mL  30 mL Oral Daily PRN Jimmy FootmanAndrea Hernandez-Gonzalez, MD      . traZODone (DESYREL) tablet 50 mg  50 mg Oral QHS Jimmy FootmanAndrea Hernandez-Gonzalez, MD   50 mg at 08/29/15 2146  . Vitamin D (Ergocalciferol) (DRISDOL) capsule 50,000 Units  50,000 Units Oral Q7 days Jimmy FootmanAndrea Hernandez-Gonzalez, MD   50,000 Units at 08/26/15 [id number]    Lab Results:  No results found for this or any previous visit (from the past 48 hour(s)).  Blood Alcohol level:  Lab Results  Component Value Date   [facility] 08[facility]   ETH <11 06/04/2013    Physical Findings: AIMS:  , ,  ,  , Dental Status Current problems with teeth and/or dentures?: No Does patient usually wear dentures?: No  CIWA:    COWS:     Musculoskeletal: Strength & Muscle Tone: within normal limits Gait & Station: normal Patient leans: N/A  Psychiatric Specialty  Exam: Review of Systems  Constitutional: Negative.   HENT: Negative.   Eyes: Negative.   Respiratory: Negative.   Cardiovascular: Negative.   Gastrointestinal: Negative.   Genitourinary: Negative.   Musculoskeletal: Negative.   Skin: Negative.   Neurological: Negative.   Endo/Heme/Allergies: Negative.   Psychiatric/Behavioral: Positive for depression. Negative for suicidal ideas, hallucinations, memory loss and substance abuse. The patient is nervous/anxious.     Blood pressure 111/74, pulse 55, temperature 98 F (36.7 C), temperature source  Oral, resp. rate 20, height 5' (1.524 m), weight 78.926 kg (174 lb), last menstrual period 10/04/2013, SpO2 99 %.Body mass index is 33.98 kg/(m^2).  General Appearance: Well Groomed  Patent attorney::  Good  Speech:  Clear and Coherent  Volume:  Normal  Mood:  Dysphoric  Affect:  Blunt  Thought Process:  Linear  Orientation:  Full (Time, Place, and Person)  Thought Content:  Hallucinations: None  Suicidal Thoughts:  No  Homicidal Thoughts:  No  Memory:  Immediate;   Good Recent;   Good Remote;   Good  Judgement:  Fair  Insight:  Fair  Psychomotor Activity:  Decreased  Concentration:  Good  Recall:  Good  Fund of Knowledge:Good  Language: Good  Akathisia:  No  Handed:    AIMS (if indicated):     Assets:  Manufacturing systems engineer Physical Health  ADL's:  Intact  Cognition: WNL  Sleep:  Number of Hours: 6.75   Treatment Plan Summary: Daily contact with patient to assess and evaluate symptoms and progress in treatment and Medication management   Major depressive disorder the patient feels that Prozac has been beneficial for her and she wants to continue this medication.  Prozac has been increased form 40 mg to 60 mg. Wellbutrin has been d/c n as the patient had a prior history of possible seizures.  Patient feels she is not yet stable to return home and take care of herself , her disable daughter and to return to work.  Family in [place]  are pressuring her to return home in order to take care of her daughter with autism.  Insomnia patient was started on mirtazapine 15 mg by mouth daily at bedtime however she appears to be overly sedated with this medication and therefore it was discontinued. Tried on trazodone 50 mg at bedtime last night with improvement.  Anxiety the patient will be continue on Ativan 0.25 mg 3 times a day as needed for anxiety  Vitamin D deficiency patient will be continued 50,000 units weekly for vitamin D  Hospitalization and status voluntary  Diet regular  Precautions every 15 minute checks  Follow-up patient will continue to follow-up with her outpatient psychiatrist --- considering intensive outpatient therapy at [facility] in [place]  Disposition patient will return to her home once stable--possible discharge in the next 24 hours  Laboratory I will order TSH and vitamin B12.---both wnl    Radene Journey MD  08/30/2015, 1:22 PM

## 2015-08-30 NOTE — Progress Notes (Signed)
   08/30/15 1530  Clinical Encounter Type  Visited With Patient  Visit Type Initial;Behavioral Health  Referral From Nurse  Consult/Referral To Chaplain  Spiritual Encounters  Spiritual Needs Grief support  Stress Factors  Patient Stress Factors Exhausted;Loss  Patient expressed feeling depressed and unmotivated after a series of deaths of relatives. She described the mourning process of [place] country [place] with its deliberate methods of grief and healing over 3 to 7 days. She found no similar grief support in the [place]. She had not adequately grieved her mother's death (12 years passed) nor any of the more recent deaths. We discussed the possibility of taking a time for grieving with other relatives. She plans to call friends to discuss this option. Chap. Shunsuke Granzow G. Birgit Nowling, ext. [phone]

## 2015-08-30 NOTE — Plan of Care (Signed)
Problem: Alteration in mood Goal: STG-Patient is able to discuss feelings and issues (Patient is able to discuss feelings and issues leading to depression)  Outcome: Progressing Patient had dialogue with writer about reason for being in the hospital

## 2015-08-30 NOTE — Plan of Care (Signed)
Problem: Alteration in mood Goal: LTG-Patient reports reduction in suicidal thoughts (Patient reports reduction in suicidal thoughts and is able to verbalize a safety plan for whenever patient is feeling suicidal)  Outcome: Progressing Denies suicidal

## 2015-08-30 NOTE — Progress Notes (Signed)
D: Patient stated slept fair last night .Stated appetite is good and energy level low. Stated concentration is poor. Stated on Depression scale 7 , hopeless 2 and anxiety 7 .( low 0-10 high) Denies suicidal  homicidal ideations  .  No auditory hallucinations  No pain concerns . Appropriate ADL'S. Interacting with peers and staff.  Goals  For today work on self esteem , seeing the good in herself . Voice of receiving pressure from ex husband. Patient  Was ,able to talk with Candelaria Stagershaplin  About grief issues  with mother and two other   A: Encourage patient participation with unit programming . Instruction  Given on  Medication , verbalize understanding. R: Voice no other concerns. Staff continue to monitor

## 2015-08-30 NOTE — Plan of Care (Signed)
Problem: [facility] Participation in Recreation Therapeutic Interventions Goal: STG-Patient will demonstrate improved self esteem by identif STG: Self-Esteem - Within 4 treatment sessions, patient will verbalize at least 5 positive affirmation statements in each of 2 treatment sessions to increase self-esteem post d/c.  Outcome: Completed/Met Date Met:  08/30/15 Treatment Session 2; Completed 2 out of 2: At approximately 2:05 pm, LRT met with patient in patient room. Patient verbalized 5 positive affirmation statements. Patient reported it felt "good". LRT encouraged patient to continue saying positive affirmation statements.  Leonette Monarch, LRT/CTRS 04.26.17 2:37 pm Goal: STG-Other Recreation Therapy Goal (Specify) STG: Stress Management - Within 4 treatment sessions, patient will verbalize understanding of the stress management techniques in each of 2 treatment sessions to increase stress management skills post d/c.  Outcome: Not Progressing Treatment Session 2; Completed 1 out of 2: At approximately 2:05 pm, LRT met with patient in patient room. Patient reported she had not read over the stress management techniques. LRT encouraged patient to read over the stress management techniques.  Leonette Monarch, LRT/CTRS 04.26.17 2:38 pm  Problem: [facility] Participation in Recreation Therapeutic Interventions Goal: STG-Other Recreation Therapy Goal (Specify) STG: Time Management - Within 4 treatment sessions, patient will verbalize understanding of scheduling in each of 2 treatment sessions to increase time management skills post d/c.  Outcome: Completed/Met Date Met:  08/30/15 Treatment Session 2; Completed 2 out of 2: At approximately 2:05 pm, LRT met with patient in patient room. Patient verbalized understanding of the scheduling.  Leonette Monarch, LRT/CTRS 04.26.17 2:40 pm

## 2015-08-30 NOTE — Tx Team (Signed)
Interdisciplinary Treatment Plan Update (Adult)         Date: 08/30/2015   Time Reviewed: 9:30 AM   Progress in Treatment: Improving Attending groups: Yes  Participating in groups: Yes  Taking medication as prescribed: Yes  Tolerating medication: Yes  Family/Significant other contact made: No, CSW still assessing for appropriate contacts Patient understands diagnosis: Yes  Discussing patient identified problems/goals with staff: Yes  Medical problems stabilized or resolved: Yes  Denies suicidal/homicidal ideation: Yes  Issues/concerns per patient self-inventory: Yes  Other:   New problem(s) identified: N/A   Discharge Plan or Barriers: Pt will discharge home to [facility] to live with her family and follow up with the [facility] for medication management and therapy   Reason for Continuation of Hospitalization:   Depression   Anxiety   Medication Stabilization   Comments: N/A   Estimated date of discharge: 08/30/15            Patient is a 45 year old female originally from [place] who was brought into [facility] emergency department for evaluation due to severe depression and inability to function. The patient has a prior history of major depressive disorder and has been receiving psychiatric care and therapy since 2014. Patient follows up with Dr.Akintayo.  Patient lives in [place].  Patient reports undergoing significant stress. 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 In addition the patient had 2 deaths in the family his week. On Monday the grandmother of her mother stepsiblings passed away and on 06-Jul-2022 her great uncle passed away. Both of them died from old age. Patient's father is in his 19s and he still lives in [place]. She is now afraid that he is going to die before she can  complete her PhD. The patient states that her father always tells her when I you going to get her PhD, I want to see you as a doctor.  Patient complains of severe depression, decreased concentration and energy and increased appetite. Patient denies having suicidal ideation. States that because of her fate (patient is Muslim) suicide is not an option. She denies having auditory or visual hallucinations. She denies having symptoms consistent with mania or hypomania.  Patient came to [place] about 20 years ago with her husband in order to complete graduate school. The patient was able to obtain a master degree at that time. For many years her main focus was to stay at home and raise their children. She has 3 children with her husband ages 5, 32 and 44 year old. This 46 year old child has autism. 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 She is now divorced from her husband they have been separated since 2007. After the divorce the patient was receiving only child support for this 46 and the 56 year old children but no alimony. The 56 year old recently had severe psychiatric issues and was hospitalized and eventually was decided that it was best for her to move in with her father who lives in [place]. The patient lost the income from this child. She is not taking care of this 39 year old child who has autism.  Patient is undergoing severe financial stress. 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 But this is  not the case anymore.  Patient has family in [place]. There she has a sister and a brother. She does not have any family here in [place]. In terms of trauma the patient denies ever being physically or sexually abuse in the past or undergone any type of trauma.  Substance abuse patient denies abusing any alcohol or any illicit substances.   Patient will benefit from crisis stabilization, medication evaluation, group therapy, and psycho education in addition to case management for discharge planning. Patient and CSW reviewed pt's identified goals and treatment plan. Pt verbalized understanding and agreed to treatment plan.    Review of initial/current patient goals per problem list:  1. Goal(s): Patient will participate in aftercare plan   Met: Yes  Target date: 3-5 days post admission date   As evidenced by: Patient will participate within aftercare plan AEB aftercare provider and housing plan at discharge being identified.   4/26: Pt will discharge home to [facility] to live with her family and follow up with the [facility] for medication management and therapy    2. Goal (s): Patient will exhibit decreased depressive symptoms and suicidal ideations.   Met: No  Target date: 3-5 days post admission date   As evidenced by: Patient will utilize self-rating of depression at 3 or below and demonstrate decreased signs of depression or be deemed stable for discharge by MD.   4/26: Goal progressing.    3. Goal(s): Patient will demonstrate decreased signs and symptoms of anxiety.   Met: No  Target date: 3-5 days post admission date   As evidenced by: Patient will utilize self-rating of anxiety at 3 or below and demonstrated decreased signs of anxiety, or be deemed stable for discharge by MD   4/26: Goal progressing.     Patient: Jane Munoz Family:  Physician: Dr. Jerilee Hoh, MD    08/30/2015 9:30 AM  Nursing: Polly Cobia, RN     08/30/2015 9:30 AM  Clinical Social Worker: Marylou Flesher, [place]  08/30/2015 9:30 AM  Clinical Social Worker: Dossie Arbour, [place]  08/30/2015 9:30 AM  Recreational Therapist: Everitt Amber, LRT    08/30/2015 9:30 AM  Other:        08/30/2015 9:30 AM  Other:        08/30/2015 9:30 AM   Alphonse Guild. Trashaun Streight, LCSWA, LCAS 08/30/15

## 2015-08-30 NOTE — Progress Notes (Signed)
Recreation Therapy Notes  Date: 04.26.17 Time: 1:00 pm Location: Craft Room  Group Topic: Self-esteem  Goal Area(s) Addresses:  Patient will identify positive traits about self. Patient will identify at least one healthy coping skill.  Behavioral Response: Attentive, Interactive  Intervention: All About Me  Activity: Patients instructed to make an All About Me pamphlet including their life's motto, positive traits about themselves, healthy coping skills, and their healthy support system.  Education: LRT educated patients on ways they can increase their self-esteem.  Education Outcome: Acknowledges education/In group clarification offered   Clinical Observations/Feedback: Patient completed activity by writing her life's motto, positive traits, healthy coping skills, and her healthy support system. Patient contributed to group discussion by stating that it was easier than she thought it would be to think of positive traits, what makes it difficult to think of positive traits, that it was easy to think of coping skills, but difficult to use them, what would change for her if she started using healthy coping skills, that it was easy to think of her support system and why it is good to have a support system.  [name],Annison Birchard M, LRT/CTRS 08/30/2015 2:17 PM

## 2015-08-30 NOTE — Discharge Summary (Addendum)
Physician Discharge Summary Note  Patient:  Jane Munoz is an 45 y.o., female MRN:  [id number] DOB:  [birth year] Patient phone:  [phone] (home)  Patient address:   [place]  [place] [address],  Total Time spent with patient: 45 minutes  Date of Admission:  08/25/2015 Date of Discharge: 08/31/15  Reason for Admission:  Self care deficits, severe depression  Principal Problem: Severe major depression, single episode, without psychotic features [facility]) Discharge Diagnoses: Patient Active Problem List   Diagnosis Date Noted  . Severe major depression, single episode, without psychotic features ([place]) [F32.2] 08/25/2015  . GAD (generalized anxiety disorder) [F41.1] 08/25/2015   History of Present Illness:  Patient is a 45 year old female originally from [place] who was brought into [facility] emergency department for evaluation due to severe depression and inability to function. The patient has a prior history of major depressive disorder and has been receiving psychiatric care and therapy since 2014. Patient follows up with Dr.Akintayo.   Patient reports undergoing significant stress. 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 In addition the patient had 2 deaths in the family his week. On Monday the grandmother of her mother stepsiblings passed away and on 07/19/22 her great uncle passed away. Both of them died from old age.   Patient's father is in his 67s and he still lives in [place]. She is now afraid that he is going to die before she can complete her PhD. The patient states that her father always tells her when I you going to get her PhD, I want to see you as a doctor.  Patient complains of severe depression, decreased concentration and energy and increased appetite. Patient denies having suicidal  ideation. States that because of her fate (patient is Muslim) suicide is not an option. She denies having auditory or visual hallucinations. She denies having symptoms consistent with mania or hypomania.  Patient came to the [place] about 20 years ago with her husband in order to complete graduate school. The patient was able to obtain a master degree at that time. For many years her main focus was to stay at home and raise their children. She has 3 children with her husband ages 97, 22 and 78 year old. This 64 year old child has autism. 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 She is now divorced from her husband they have been separated since 2007. After the divorce the patient was receiving only child support for this 84 and the 65 year old children but no alimony. The 70 year old recently had severe psychiatric issues and was hospitalized and eventually was decided that it was best for her to move in with her father who lives in [place]. The patient lost the income from this child. She is not taking care of this 57 year old child who has autism.   Patient is undergoing severe financial stress. 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 But this is not the case anymore.  Patient has family in [place]. There she has a sister and a brother. She does not have any family here in [place].   In terms of trauma the patient denies ever being physically or sexually abuse in the past or undergone any  type of trauma.  Substance abuse patient denies abusing any alcohol or any illicit substances.   Signs/Symptoms: Depression Symptoms: depressed mood, insomnia, psychomotor agitation, feelings of worthlessness/guilt, loss of energy/fatigue, increased appetite, (Hypo) Manic Symptoms: denies Anxiety Symptoms: Excessive Worry, Psychotic  Symptoms: Denies PTSD Symptoms: Negative    Past Psychiatric History: patient follows up with Dr. Karn Cassis 10 AM. She also received counseling services at her [facility]. She denies prior psychiatric hospitalizations. She denies suicidal attempts. Patient states that Dr. Ala Bent was prescribing her with Wellbutrin 150 mg a day which she was taking along with fluoxetine 40 mg which was prescribed by her primary care provider. She ran out of the prescription for Wellbutrin has not taken it since December. However she has been taking the fluoxetine 40 mg a day.   Past Medical History: Denies any history of head trauma. She does report having seizures while visiting her family in [place] in 2014. She tells me she has had a 24-hour EEG here in the [place] that was negative. Past Medical History  Diagnosis Date  . Anxiety and depression   . Vertigo     chronic, s/p eval with cardiology, neurology and ENT  . H/O varicella   . Blood transfusion   . H/O blood clots     DVT with pregnancy  . H/O bladder infections   . Leaking of urine   . Preterm labor   . H/O dysmenorrhea 05/08/11  . Adenomyosis 05/08/11  . Pelvic pain 1/2/`13  . Heart murmur   . Colon polyps   . Elevated d-dimer- 1.29 on Oct 27th 2014- CTA neg for PE, LE venous dopplers neg for DVT 03/10/13 03/06/2013    Past Surgical History  Procedure Laterality Date  . Hernia repair    . Wisdom tooth extraction    . Cesarean section       x 3   Family History:  Family History  Problem Relation Age of Onset  . Cancer Mother     Abdominal cancer  . Heart Problems Mother   . Diabetes Father   . Hypertension Father   . Asthma Sister   . Arthritis Sister   . Heart disease Mother   . Heart disease Sister   . CVA Maternal Aunt   . Diabetes Sister    Family Psychiatric History: Patient reports that one of her daughters who is 55 year old has autism. Her 47 year old has been hospitalized psychiatrically twice due to suicidality  and depression. The patient has a great uncle with bipolar and drug addiction.  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 Patient is divorced and lives with her 81 year old daughter with autism. The patient's total of 3 children ages 75, 23 and 54. The 43 and a 47 year old live with their father in [place]. Patient reports having Medicaid. History  Alcohol Use No     History  Drug Use No    Social History   Social History  . Marital Status: Divorced    Spouse Name: N/A  . Number of Children: 3  . Years of Education: Grad   Occupational History  . TEACHER    Social History Main Topics  . Smoking status: Never Smoker   . Smokeless tobacco: Never Used  . Alcohol Use: No  . Drug Use: No  . Sexual Activity: Not Asked   Other Topics Concern  . 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 Prozac has been increased form 40 mg to 60 mg. Wellbutrin has been d/c n as the patient had a prior history of possible seizures. Patient feels she is not yet stable to return home and take care of herself , her disable daughter and to return to work. Family in [place] are pressuring her to return home in order to take care of her daughter with autism.  Insomnia patient was started on mirtazapine 15 mg by mouth daily at bedtime however she appears to be overly sedated with this medication and therefore it was discontinued. Tried on trazodone 50 mg at bedtime  with improvement.  Anxiety the patient will be continue on Ativan 0.25 mg 3 times a day as needed for  anxiety  Vitamin D deficiency patient will be continued 50,000 units weekly for vitamin D  Hospitalization and status voluntary  Follow-up patient will continue to follow-up with her outpatient psychiatrist Dr Arvin Collard also referred her to intensive outpatient therapy at [facility] in [place] but they don't accept medicaid.  Disposition patient will return to her home today  Laboratory I will order TSH and vitamin B12.---both wnl  On discharge the patient denied hopelessness, helplessness, suicidality, homicidality. She says her religion believes that suicide is not an option. She feels that her mood has improved, and her energy and her sleep also having problems with the medications. She denies side effects. She denies physical complaints today.  She did not display any unsafe or disruptive behaviors while in the unit. She participated actively in programming.  Patient is worried about returning home after her discharge and having to reassume all her responsibilities. She fears that she will feel overwhelmed and unable to cope. To the point that she says I don't think my daughter is safe around me (meaning that she will not be able to provide the level of care that her disable 55 y/odaughter needs). Patient stated that she has been receiving phone calls from her husband and from her 52 year old son. They have been asking her when she going to be discharged as they want her to take care of their 51 year old daughter.  Her ex-husband's wife just get birth to twins a few days ago.  Therefore they have been putting more pressure on the patient about returning home and taking care of the daughter.   SW and I contacted pt's ex husband he refuses to keep their daughter any longer. Says he his wife whom just had a C section and gave birth to twins. They had 2 toddlers in the house. Wants to know when his ex wife would be d/c so he can drive their daughter back here. We offered to complete AFML  for him but he declined.  Pt's sister might be able to come to [facility] and help her. Sister has offer her to move in with them to [place] once pt's lease is up in 2 m.  Letter given recommending more hours of service for her disable daughter.   Physical Findings: AIMS:  , ,  ,  , Dental Status Current  problems with teeth and/or dentures?: No Does patient usually wear dentures?: No  CIWA:    COWS:     Musculoskeletal: Strength & Muscle Tone: within normal limits Gait & Station: normal Patient leans: N/A  Psychiatric Specialty Exam: Review of Systems  Constitutional: Negative.   HENT: Negative.   Eyes: Negative.   Respiratory: Negative.   Cardiovascular: Negative.   Gastrointestinal: Negative.   Genitourinary: Negative.   Musculoskeletal: Negative.   Skin: Negative.   Neurological: Negative.   Endo/Heme/Allergies: Negative.   Psychiatric/Behavioral: Negative.     Blood pressure 108/71, pulse 83, temperature 98.4 F (36.9 C), temperature source Oral, resp. rate 20, height 5' (1.524 m), weight 78.926 kg (174 lb), last menstrual period 10/04/2013, SpO2 99 %.Body mass index is 33.98 kg/(m^2).  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 (Cigarettes, Smokeless Tobacco, Cigars, and/or  Pipes): No  Has this patient used any form of tobacco in the last 30 days? (Cigarettes, Smokeless Tobacco, Cigars, and/or Pipes) Yes, No  Blood Alcohol level:  Lab Results  Component Value Date   ETH <5 08/24/2015   ETH <11 42/87/6811    Metabolic Disorder Labs:  No results found for: HGBA1C, MPG Lab Results  Component Value Date   PROLACTIN 12.2 10/14/2011   No results found for: CHOL, TRIG, HDL, CHOLHDL, VLDL, LDLCALC  Results for Jane Munoz, Jane Munoz (MRN [id number]) as of 08/30/2015 15:00  Ref. Range 08/24/2015 17:22 08/24/2015 18:53 08/26/2015 11:46 08/26/2015 17:59  Sodium Latest Ref Range: 135-145 mmol/L 138     Potassium Latest Ref Range: 3.5-5.1 mmol/L 3.8     Chloride Latest Ref Range: 101-111 mmol/L 103     CO2 Latest Ref Range: 22-32 mmol/L 24     BUN Latest Ref Range: 6-20 mg/dL 13     Creatinine Latest Ref Range: 0.44-1.00 mg/dL 0.47     Calcium Latest Ref Range: 8.9-10.3 mg/dL 9.6     EGFR (Non-African Amer.) Latest Ref Range: >60 mL/min >60     EGFR (African American) Latest Ref Range: >60 mL/min >60     Glucose Latest Ref Range: 65-99 mg/dL 84     Anion gap Latest Ref Range: 5-15  11     Alkaline Phosphatase Latest Ref Range: 38-126 U/L 121     Albumin Latest Ref Range: 3.5-5.0 g/dL 4.0     AST Latest Ref Range: 15-41 U/L 29     ALT Latest Ref Range: 14-54 U/L 26     Total Protein Latest Ref Range: 6.5-8.1 g/dL 8.1     Total Bilirubin Latest Ref Range: 0.3-1.2 mg/dL 0.4     Vitamin B12 Latest Ref Range: 180-914 pg/mL   470  WBC Latest Ref Range: 4.0-10.5 K/uL 5.7     RBC Latest Ref Range: 3.87-5.11 MIL/uL 4.97     Hemoglobin Latest Ref Range: 12.0-15.0 g/dL 14.7     HCT Latest Ref Range: 36.0-46.0 % 42.5     MCV Latest Ref Range: 78.0-100.0 fL 85.5     MCH Latest Ref Range: 26.0-34.0 pg 29.6     MCHC Latest Ref Range: 30.0-36.0 g/dL 34.6     RDW Latest Ref Range: 11.5-15.5 % 13.4     Platelets Latest Ref Range: 150-400 K/uL 274     Acetaminophen (Tylenol), S  Latest Ref Range: 10-30 ug/mL <73 (L)     Salicylate Lvl Latest Ref Range: 2.8-30.0 mg/dL <4.0     TSH Latest Ref Range: 0.350-4.500 uIU/mL   2.524   Appearance Latest Ref Range: CLEAR     CLEAR (A)  Bacteria, UA Latest Ref Range: NONE SEEN     NONE SEEN  Bilirubin Urine Latest Ref Range: NEGATIVE     NEGATIVE  Color, Urine Latest Ref Range: YELLOW     STRAW (A)  Glucose Latest Ref Range: NEGATIVE mg/dL    NEGATIVE  Hgb urine dipstick Latest Ref Range: NEGATIVE     NEGATIVE  Ketones, ur Latest Ref Range: NEGATIVE mg/dL    NEGATIVE  Leukocytes, UA Latest Ref Range: NEGATIVE     NEGATIVE  Nitrite Latest Ref Range: NEGATIVE     NEGATIVE  pH Latest Ref Range: 5.0-8.0     6.0  Protein Latest Ref Range: NEGATIVE mg/dL    NEGATIVE  RBC / HPF Latest Ref Range: 0-5 RBC/hpf    0-5  Specific Gravity, Urine Latest Ref Range: 1.005-1.030     1.005  Squamous Epithelial / LPF Latest Ref Range: NONE SEEN     0-5 (A)  WBC, UA Latest Ref Range: 0-5 WBC/hpf    0-5  Alcohol, Ethyl (B) Latest Ref Range: <5 mg/dL <5     Amphetamines Latest Ref Range: NONE DETECTED   NONE DETECTED    Barbiturates Latest Ref Range: NONE DETECTED   NONE DETECTED    Benzodiazepines Latest Ref Range: NONE DETECTED   NONE DETECTED    Opiates Latest Ref Range: NONE DETECTED   NONE DETECTED    COCAINE Latest Ref Range: NONE DETECTED   NONE DETECTED    Tetrahydrocannabinol Latest Ref Range: NONE DETECTED   NONE DETECTED      See Psychiatric Specialty Exam and Suicide Risk Assessment completed by Attending Physician prior to discharge.  Discharge destination:  Home  Is patient on multiple antipsychotic therapies at discharge:  No   Has Patient had three or more failed trials of antipsychotic monotherapy by history:  No  Recommended Plan for Multiple Antipsychotic Therapies: NA     Medication List    TAKE these medications      Indication   FLUoxetine 20 MG capsule  Commonly known as:  PROZAC  Take 3 capsules (60 mg  total) by mouth daily.  Notes to Patient:  Depression and anxiety      LORazepam 0.5 MG tablet  Commonly known as:  ATIVAN  Take 0.5 tablets (0.25 mg total) by mouth every 8 (eight) hours as needed for anxiety or sleep.  Notes to Patient:  anxiety      traZODone 50 MG tablet  Commonly known as:  DESYREL  Take 1 tablet (50 mg total) by mouth at bedtime.  Notes to Patient:  insomnia      Vitamin D (  Ergocalciferol) 50000 units Caps capsule  Commonly known as:  DRISDOL  Take 50,000 Units by mouth every 7 (seven) days.  Notes to Patient:  Vit d deficiency         Follow-up Information    Follow up with [facility].   Why:  Please arrive on Tuesday May 2nd at 5pm for your medication managment hospital follow up and on Tuesday May 9th at 4pm for your therapy appt with Alyssa.   Contact information:   [address] [place], [place] [address] [phone] Fax: [phone]      Follow up with Brenda at [facility]. Go on 09/05/2016.   Why:  Please arrive for your hospital follow up with your family medicine doctor Lucretia Kern on Thursday May 3rd at 1:00pm. Please bring your discharge summary when you go.   Contact information:   [place] [address] Ph: [phone] Fax: [phone]       Call [facility].   Why:  Please arrive on Tuesday May 2nd at 12pm Gwendolyn Fill) for your therapy and resource gathering session with Maris Berger information:   [place], [place] [address] Hours: Open today  9AM-7pm Phone: [phone]      Call [facility].   Why:  please call Iformation Referral Specialist Rivka Barbara upon discharge at ph: [phone] ext [phone] for more information on the Autism Society and how they can help you find support for you and your daughter   Contact information:   [address] [place], [place]. [phone]      Follow up with [facility].    WhyJory Sims Ph: [phone]      Follow up with DSS Involvement.      >30 minutes. >50 % of the time was spent in coordination of care  Signed: Hildred Priest, MD 09/01/2015, 9:12 AM

## 2015-08-31 MED ORDER — TRAZODONE HCL 50 MG PO TABS: 50.0000 mg | ORAL_TABLET | Freq: Every day | ORAL | Status: AC

## 2015-08-31 MED ORDER — LORAZEPAM 0.5 MG PO TABS: 0.2500 mg | ORAL_TABLET | Freq: Three times a day (TID) | ORAL | Status: AC | PRN

## 2015-08-31 MED ORDER — FLUOXETINE HCL 20 MG PO CAPS: 60.0000 mg | ORAL_CAPSULE | Freq: Every day | ORAL | Status: AC

## 2015-08-31 NOTE — Plan of Care (Signed)
Problem: Alteration in mood Goal: LTG-Patient reports reduction in suicidal thoughts (Patient reports reduction in suicidal thoughts and is able to verbalize a safety plan for whenever patient is feeling suicidal)  Outcome: Progressing Denies suicidal ideations     

## 2015-08-31 NOTE — [facility] Suicide Risk Assessment (Addendum)
[facility]   Principal Problem: Severe major depression, single episode, without psychotic features [facility](HCC) Discharge Diagnoses:  Patient Active Problem List   Diagnosis Date Noted  . Severe major depression, single episode, without psychotic features (HCC) [F32.2] 08/25/2015  . GAD (generalized anxiety disorder) [F41.1] 08/25/2015        Psychiatric Specialty Exam: ROS  Blood pressure 108/71, pulse 83, temperature 98.4 F (36.9 C), temperature source Oral, resp. rate 20, height 5' (1.524 m), weight 78.926 kg (174 lb), last menstrual period 10/04/2013, SpO2 99 %.Body mass index is 33.98 kg/(m^2).                                                       Mental Status Per Nursing Assessment::   On Admission:  NA  Demographic Factors:  Divorced or widowed and Low socioeconomic status  Loss Factors: Financial problems/change in socioeconomic status  Historical Factors: NA  Risk Reduction Factors:   Responsible for children under 47 years of age, Sense of responsibility to family, Religious beliefs about death, Employed and Positive social support  Continued Clinical Symptoms:  Previous Psychiatric Diagnoses and Treatments  Cognitive Features That Contribute To Risk:  None    Suicide Risk:  Minimal: No identifiable suicidal ideation.  Patients presenting with no risk factors but with morbid ruminations; may be classified as minimal risk based on the severity of the depressive symptoms   Jimmy FootmanHernandez-Gonzalez,  Gaetano Romberger, MD 09/01/2015, 9:09 AM

## 2015-08-31 NOTE — Progress Notes (Signed)
D: Affect flat , and depressed . Patient  Slept  For long period this am in bed . Patient voice of  having a break down last night .  Stated she is still upset about  Her rex husband  Making demands  On her to come home and take care of her daughter. No breakfast this am shift.  No group participation   Appropriate ADL'S. Interacting with peers and staff.  A: Encourage patient participation with unit programming . Instruction  Given on  Medication , verbalize understanding. R: Voice no other concerns. Staff continue to monitor

## 2015-08-31 NOTE — Progress Notes (Signed)
[facility]  08/31/2015 11:16 AM Jane Munoz  MRN:  [id number] Subjective: On 4/26 Pt was seeing on the phone with husband.  Husband continues to demand that she returns home and takes care of her disable daughter.  Pt became agitated last night and yelled at nurses.  Pt had to be given ativan.  Mood is depressed again feeling hopeless  Today refuses to get up from bed even for meals.   She has been voicing concerns about her ex- husband contacting her asking when she would come get her daughter back. The patient states that this is causing her anxiety and is concerned that she is not ready to take care of her daughter yet. She feels she still has a lot to think about and feels overwhelmed with her problems. She tells me that her 594 year old daughter that suffers from autism  is very low functioning. She is still wearing adult diapers and receives nursing care from the time she leaves school until 7 PM.  Patient worries about her finances and ability to pay her bills. She says that she has been dependent on financial help from her sister who lives in [place] and she feels like a burden for them.  She also feels she is a disappointment from her father who has been asking her to complete her PhD. The patient has attempted to complete her PhD in 2 locations but she has failed.   On 4/24 pt was complaining of numbness in left leg and right inguinal pain.  Also reported UTI symptoms.  UA neg.  This patient has a prior history of significant somatization. She reported that after her brother's death a few years ago she suffered from "convulsions". 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 The patient reports undergoing significant workup that did not reveal any abnormalities.   UA collected on Saturday was negative for UTI.  All labs are within the normal limits.  Vitamin B12 and TSH are within the normal  limits.   Principal Problem: Severe major depression, single episode, without psychotic features (HCC) Diagnosis:   Patient Active Problem List   Diagnosis Date Noted  . Severe major depression, single episode, without psychotic features (HCC) [F32.2] 08/25/2015  . GAD (generalized anxiety disorder) [F41.1] 08/25/2015   Total Time spent with patient: 30 minutes   Past Medical History:  Past Medical History  Diagnosis Date  . Anxiety and depression   . Vertigo     chronic, s/p eval with cardiology, neurology and ENT  . H/O varicella   . Blood transfusion   . H/O blood clots     DVT with pregnancy  . H/O bladder infections   . Leaking of urine   . Preterm labor   . H/O dysmenorrhea 05/08/11  . Adenomyosis 05/08/11  . Pelvic pain 1/2/`13  . Heart murmur   . Colon polyps   . Elevated d-dimer- 1.29 on Oct 27th 2014- CTA neg for PE, LE venous dopplers neg for DVT 03/10/13 03/06/2013    Past Surgical History  Procedure Laterality Date  . Hernia repair    . Wisdom tooth extraction    . Cesarean section       x 3   Family History:  Family History  Problem Relation Age of Onset  . Cancer Mother     Abdominal cancer  . Heart Problems Mother   . Diabetes Father   . Hypertension  Father   . Asthma Sister   . Arthritis Sister   . Heart disease Mother   . Heart disease Sister   . CVA Maternal Aunt   . Diabetes Sister     Social History:  History  Alcohol Use No     History  Drug Use No    Social History   Social History  . Marital Status: Divorced    Spouse Name: N/A  . Number of Children: 3  . Years of Education: Grad   Occupational History  . TEACHER    Social History Main Topics  . Smoking status: Never Smoker   . Smokeless tobacco: Never Used  . Alcohol Use: No  . Drug Use: No  . Sexual Activity: Not Asked   Other Topics Concern  . 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 acetaminophen (TYLENOL) tablet 650 mg  650 mg Oral Q6H PRN Jimmy Footman, MD      . alum & mag hydroxide-simeth (MAALOX/MYLANTA) 200-200-20 MG/5ML suspension 30 mL  30 mL Oral Q4H PRN Jimmy Footman, MD   30 mL at 08/27/15 2109  . FLUoxetine (PROZAC) capsule 60 mg  60 mg Oral Daily Jimmy Footman, MD   60 mg at 08/31/15 0943  . LORazepam (ATIVAN) tablet 0.25 mg  0.25 mg Oral TID PRN Jimmy Footman, MD   0.25 mg at 08/30/15 2038  . magnesium hydroxide (MILK OF MAGNESIA) suspension 30 mL  30 mL Oral Daily PRN Jimmy Footman, MD      . traZODone (DESYREL) tablet 50 mg  50 mg Oral QHS Jimmy Footman, MD   50 mg at 08/30/15 2038  . Vitamin D (Ergocalciferol) (DRISDOL) capsule 50,000 Units  50,000 Units Oral Q7 days Jimmy Footman, MD   50,000 Units at 08/26/15 4098    Lab Results:  No results found for this or any previous visit (from the past 48 hour(s)).  Blood Alcohol level:  Lab Results  Component Value Date   [facility] <5 08/24/2015   ETH <11 06/04/2013    Physical Findings: AIMS:  , ,  ,  , Dental Status Current problems with teeth and/or dentures?: No Does patient usually wear dentures?: No  CIWA:    COWS:     Musculoskeletal: Strength & Muscle Tone: within normal limits Gait & Station: normal Patient leans: N/A  Psychiatric Specialty Exam: Review of Systems  Constitutional: Negative.   HENT: Negative.   Eyes: Negative.   Respiratory: Negative.   Cardiovascular: Negative.   Gastrointestinal: Negative.   Genitourinary: Negative.   Musculoskeletal: Negative.   Skin:  Negative.   Neurological: Negative.   Endo/Heme/Allergies: Negative.   Psychiatric/Behavioral: Positive for depression. Negative for suicidal ideas, hallucinations, memory loss and substance abuse. The patient is nervous/anxious.     Blood pressure 119/74, pulse 79, temperature 98 F (36.7 C), temperature source Oral, resp. rate 20, height 5' (1.524 m), weight 78.926 kg (174 lb), last menstrual period 10/04/2013, SpO2 99 %.Body mass index is 33.98 kg/(m^2).  General Appearance: Well Groomed  Eye  Contact::  Good  Speech:  Clear and Coherent  Volume:  Normal  Mood:  Dysphoric  Affect:  Blunt  Thought Process:  Linear  Orientation:  Full (Time, Place, and Person)  Thought Content:  Hallucinations: None  Suicidal Thoughts:  No  Homicidal Thoughts:  No  Memory:  Immediate;   Good Recent;   Good Remote;   Good  Judgement:  Fair  Insight:  Fair  Psychomotor Activity:  Decreased  Concentration:  Good  Recall:  Good  Fund of Knowledge:Good  Language: Good  Akathisia:  No  Handed:    AIMS (if indicated):     Assets:  Manufacturing systems engineer Physical Health  ADL's:  Intact  Cognition: WNL  Sleep:  Number of Hours: 7.45   Treatment Plan Summary: Daily contact with patient to assess and evaluate symptoms and progress in treatment and Medication management   Major depressive disorder the patient feels that Prozac has been beneficial for her and she wants to continue this medication.  Prozac has been increased form 40 mg to 60 mg. Wellbutrin has been d/c n as the patient had a prior history of possible seizures.  Patient feels she is not yet stable to return home and take care of herself , her disable daughter and to return to work.  Family in [place] are pressuring her to return home in order to take care of her daughter with autism.  Staff brought up the concerns with pts ability to take care of her daughter at this time.  Nursing discussed the possibility of placement for her daughter who  is total care.  Will discuss with pt and she is interested we could contact the daughters pediatrician.   Insomnia patient was started on mirtazapine 15 mg by mouth daily at bedtime however she appears to be overly sedated with this medication and therefore it was discontinued. Tried on trazodone 50 mg at bedtime last night with improvement.  Anxiety the patient will be continue on Ativan 0.25 mg 3 times a day as needed for anxiety  Vitamin D deficiency patient will be continued 50,000 units weekly for vitamin D  Hospitalization and status voluntary  Diet regular  Precautions every 15 minute checks  Follow-up patient will continue to follow-up with her outpatient psychiatrist --- considering intensive outpatient therapy at [facility] in [place]--Referral made today--unfortunately they did not accept medicaid.   Considering [facility].  SW and I contacted pt's ex husband he refuses to keep their daughter any longer. Says he his wife whom just had a C section and gave birth to twins.  They had 2 toddlers in the house. Wants to know when his ex wife would be d/c so he can drive their daughter back here.  We offered to complete AFML for him but he declined.  Pt's sister might be able to come to [facility] and help her.   Disposition patient will return to her home once stable--had plans to d/c pt today hever she refuses to get up from bed even for meals.  Says she feels like she can't talk.  Per nursing had an outburst last night and yelled at nurses (this is completely out of character for her)  Laboratory I will order TSH and vitamin B12.---both wnl    Radene Journey MD  08/31/2015, 11:16 AM

## 2015-08-31 NOTE — Progress Notes (Signed)
Patient ID: Jane BergamoAfraa Munoz, female   DOB: 1971-02-03, 45 y.o.   MRN: [id number] CSW spoke to the pt's sister at ph: [phone] who lives in [place] (she says  is willing to fly here to support her sister if necessary) and requested a family meeting.  Pt's sister informed the CSw that the pt's ex-husband is exerting immense pressure on the pt to return home and take care of there 6116-47 yr old autistic daughter by exerting pressure on the pt's son who is calling the pt and asking ina frantic manner, "When are you coming home?".  The pt is responding by regressing emotionally and balking at being asked about discharge.  Pt's siter said the pt's other daughter has already attempted suicide and that the pt called her sister and is afraid that the pt's son will also attempt suicide as a result of pressure from the pt's ex-husband.  CSW called the [employer]ism Society of [place] on 08/30/15 [facility], where the pt lives and left a message for Charleston RopesJudy Smith-Mithmeyer at ph: [phone], who is an Financial controllerAutism Resource Specialist and who will return to her office on 08/31/15.  CWS also called the [employer] [facility] [place] at ph: [phone]

## 2015-08-31 NOTE — Progress Notes (Signed)
Recreation Therapy Notes  Date: 04.27.17 Time: 1:00 pm Location: Craft Room  Group Topic: Leisure Education  Goal Area(s) Addresses:  Patient will identify things they are grateful for. Patient will identify how being grateful can influence decision making.  Behavioral Response: Did not attend  Intervention: Grateful Wheel  Activity: Patients were given a I Am Grateful For worksheet and instructed to write at least one thing they are grateful for under each category.  Education: LRT educated patients on why it is important to be aware of things we are grateful for.  Education Outcome: Patient did not attend group.  Clinical Observations/Feedback: Patient did not attend group.  [name],Wilmetta Speiser M, LRT/CTRS 08/31/2015 2:26 PM

## 2015-08-31 NOTE — Progress Notes (Signed)
Calm and cooperative. Med compliant. Med compliant. No voiced thoughts of hurting herself. Slept 7.45 hours. No behavior problems noted.

## 2015-08-31 NOTE — Plan of Care (Signed)
Problem: [facility] Participation in Recreation Therapeutic Interventions Goal: STG-Other Recreation Therapy Goal (Specify) STG: Stress Management - Within 4 treatment sessions, patient will verbalize understanding of the stress management techniques in each of 2 treatment sessions to increase stress management skills post d/c.  Outcome: Completed/Met Date Met:  08/31/15 Treatment Session 3; Completed 2 out of 2: At approximately 2:10 pm, LRT met with patient in patient room. Patient reported she read over one stress management technique. Patient had some questions. LRT answered the questions. Patient verbalized understanding. LRT encouraged patient to practice the stress management techniques.  Leonette Monarch, LRT/CTRS 04.27.17 2:46 pm

## 2015-08-31 NOTE — Plan of Care (Signed)
Problem: [facility] Participation in Recreation Therapeutic Interventions Goal: STG-Patient will demonstrate improved self esteem by identif STG: Self-Esteem - Within 3 treatment sessions, patient will verbalize at least 5 positive affirmation statements in one treatment session to increase self-esteem post d/c. Outcome: Completed/Met Date Met:  08/31/15 Treatment Session 1; Completed 1 out of 1: At approximately 2:10 pm, LRT met with patient in patient room. Patient verbalized 5 positive affirmation statements. Patient reported, "I have a burst of energy." LRT encouraged patient to continue saying positive affirmation statements.  Leonette Monarch, LRT/CTRS 04.27.17 2:48 pm

## 2015-08-31 NOTE — [facility] Group Notes (Signed)
[facility] LCSW Group Therapy  08/31/2015 2:58 PM  Type of Therapy:  Group Therapy  Participation Level:  Did Not Attend  Modes of Intervention:  Discussion, Education, Socialization and Support  Summary of Progress/Problems: Balance in life: Patients will discuss the concept of balance and how it looks and feels to be unbalanced. Pt will identify areas in their life that is unbalanced and ways to become more balanced.    Arvella Massingale L Marche Hottenstein MSW, LCSWA  08/31/2015, 2:58 PM   

## 2015-09-01 NOTE — Progress Notes (Signed)
  [facility] :  Will you be returning to the same living situation after discharge:  Yes,  [place] children At discharge, do you have transportation home?: Yes,  pt will be picked up by a friend Do you have the ability to pay for your medications: Yes,  pt will be provided with prescriptions at discharge  Release of information consent forms completed and in the chart;  Patient's signature needed at discharge.  Patient to Follow up at: Follow-up Information    Go to [facility].   Why:  Please arrive on Tuesday May 2nd at 5pm for your medication managment hospital follow up and on Tuesday May 9th at 4pm for your therapy appt with Alyssa.   Contact information:   [address] [place], [place] [address] [phone] Fax: [phone]      Please follow up.   Contact information:          Go to [facility] [place].   Why:  Please arrive on Tuesday May 2nd at 12pm Ladene Artist(Noon) for your therapy and resource gathering session with Irving Burtonmily.  Please call to reschedule if necessary.   Contact information:   [address] [place], [place] [address] Hours: Open today  9AM-7pm Ph: [phone] Do not Fax:      Call [facility].   Why:  Please call Information Referral Specialist Rolley SimsJudy Mithmeyer upon discharge at ph: [phone] ext [phone] for more information on the Autism Society and how they can help you find support for you and your daughter   Contact information:   [address] [place] [address] Ph: [phone] Do Not FAX      [facility] to [facility].   Why:  Please arrive on Friday May 5th at 10:20am to see Dr. Berniece AndreasAngrish for your primary care hospital follow up   Contact information:   [address] [place], [place] [address] Phone: [phone] Fax: [phone]      Next level of care provider has access to [facility]  Health Link:no  Safety Planning and Suicide Prevention discussed: Yes,  completed with pt  Have you used any form of tobacco in the last 30 days? (Cigarettes, Smokeless Tobacco, Cigars, and/or Pipes): No  Has patient been referred to the Quitline?: N/A patient is not a smoker  Patient has been referred for addiction treatment: N/A  Mercy RidingJonathan F Govani Radloff 09/01/2015, 3:11 PM

## 2015-09-01 NOTE — [facility] Suicide Risk Assessment (Signed)
[facility] INPATIENT:  Family/Significant Other Suicide Prevention Education  Suicide Prevention Education:  Patient Refusal for Family/Significant Other Suicide Prevention Education: The patient Jane Munoz has refused to provide written consent for family/significant other to be provided Family/Significant Other Suicide Prevention Education during admission and/or prior to discharge.  Physician notified.  CSW completed with pt.  Dorothe PeaJonathan F Harry Bark 09/01/2015, 3:05 PM

## 2015-09-01 NOTE — Plan of Care (Signed)
Problem: Ineffective individual coping Goal: STG-Increase in ability to manage activities of daily living Outcome: Progressing Pt appropriately attends to ADLs.  Problem: Alteration in mood Goal: LTG-Patient reports reduction in suicidal thoughts (Patient reports reduction in suicidal thoughts and is able to verbalize a safety plan for whenever patient is feeling suicidal)  Outcome: Progressing Pt denies SI at this time.

## 2015-09-01 NOTE — Progress Notes (Addendum)
Patient ID: Vinetta BergamoAfraa Munoz, female   DOB: 1971/03/16, 45 y.o.   MRN: [id number] CSW provided the following resources for the pt at discharge:   Please call your daughter's Care Coordinator Osvaldo HumanJamie Buccola Ph: [phone] at [facility] (Med[facility]) so she can assist you with achieving a higher level of care for your daughter.  Miss Joyice FasterBuccolla is on vacation until next week and her supervisor Griffin Dakinshley Lucas can assist you until then.  Please call Griffin Dakinshley Lucas at ph: [phone]   Please call and go to the [facility] at:  [address] [place], [place] [address] Hours: Open today  9AM-7pm Ph: [phone] ..for the following support groups:   -Women's Emotional Wellness Support Group Meeting Details:  Wednesdays, 12:30-2:00 PM  Location: [facility],   [address] , [place] [address]  Note: New participants please call to register at [phone]  Women's Emotional Wellness is a support group co-facilitated by [facility] and the [facility] [place]. This group connects women who share similar experiences in their life's journey, fostering understanding, compassion and support. Peer-led groups provide opportunities for members to exchange information, advice, coping skills, and to receive emotional support from others who can relate.  -Family & Friends is a support group sponsored by the Mental Health Association in [place] for individuals who have a loved one with a mental health concern.  Our peer-led group provides a welcoming, supportive environment. In Family & Friends you can learn strategies to maintain your emotional, mental, and physical well-being while supporting your loved one.  We offer factual information, strategies for assisting rather than   Meeting Details:  Tuesdays, 7:00-8:30 PM  Location: [facility],  [address]   [place], [place] [address]   -Road to Recovery Support Group is a support group  sponsored by [facility]. It is offered three times a week for individuals with mental health concerns.  In Road to Recovery, you can learn more about diagnoses and learn strategies to maintain emotional, mental, and physical well-being. You can connect with other people who have similar symptoms and challenges, finding common ground and sharing experiences. .Intended for individuals with mental health conditions. .Trained facilitators have a diagnosis or experience with the group's focus. .Participants must be 18 years or older. .All meetings are confidential  .Meeting Details:  Facilitator: Harriett SineScott Ciallella  Location: [facility]  [address]  [place], [place] [address]  Dorothe PeaJonathan F. Celisa Schoenberg, LCSWA, LCAS  09/01/15

## 2015-09-01 NOTE — Progress Notes (Signed)
Recreation Therapy Notes  INPATIENT RECREATION TR PLAN  Patient Details Name: Jane Munoz MRN: [id number] DOB: April 23, 1971 Today's Date: 09/01/2015  Rec Therapy Plan Is patient appropriate for Therapeutic Recreation?: Yes Treatment times per week: At least once a week TR Treatment/Interventions: 1:1 session, Group participation (Comment) (Appropriate participation in daily recreational therapy tx)  Discharge Criteria Pt will be discharged from therapy if:: Treatment goals are met, Discharged Treatment plan/goals/alternatives discussed and agreed upon by:: Patient/family  Discharge Summary Short term goals set: See Care Plan Short term goals met: Complete Progress toward goals comments: One-to-one attended Which groups?: Self-esteem, Goal setting, Social skills, Other (Comment) (Self-expression) One-to-one attended: Self-esteem, stress management, time management Reason goals not met: N/A Therapeutic equipment acquired: None Reason patient discharged from therapy: Discharge from hospital Pt/family agrees with progress & goals achieved: Yes Date patient discharged from therapy: 09/01/15   Leonette Monarch, LRT/CTRS 09/01/2015, 3:26 PM

## 2015-09-01 NOTE — Tx Team (Signed)
Interdisciplinary Treatment Plan Update (Adult)         Date: 09/01/2015   Time Reviewed: 9:30 AM   Progress in Treatment: Improving Attending groups: Yes  Participating in groups: Yes  Taking medication as prescribed: Yes  Tolerating medication: Yes  Family/Significant other contact made: No, CSW still assessing for appropriate contacts Patient understands diagnosis: Yes  Discussing patient identified problems/goals with staff: Yes  Medical problems stabilized or resolved: Yes  Denies suicidal/homicidal ideation: Yes  Issues/concerns per patient self-inventory: Yes  Other:   New problem(s) identified: N/A   Discharge Plan or Barriers: Pt will discharge home to [facility] to live with her family and follow up with the [facility] for medication management and therapy, with [facility] for her hospital follow up, with the [facility] for a counseling session assisting the pt with resources in the community and with the [facility] [place] [facility] [place] for needed resources to assist the pt with her autistic daughter upon discharge.   Reason for Continuation of Hospitalization:   Depression   Anxiety   Medication Stabilization   Comments: N/A   Estimated date of discharge: 08/30/15            Patient is a 45 year old female originally from [place] who was brought into [facility] emergency department for evaluation due to severe depression and inability to function. The patient has a prior history of major depressive disorder and has been receiving psychiatric care and therapy since 2014. Patient follows up with Dr.Akintayo.  Patient lives in [place].  Patient reports undergoing significant stress. 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 In addition the patient  had 2 deaths in the family his week. On Monday the grandmother of her mother stepsiblings passed away and on 06-26-2022 her great uncle passed away. Both of them died from old age. Patient's father is in his 28s and he still lives in [place]. She is now afraid that he is going to die before she can complete her PhD. The patient states that her father always tells her when I you going to get her PhD, I want to see you as a doctor.  Patient complains of severe depression, decreased concentration and energy and increased appetite. Patient denies having suicidal ideation. States that because of her fate (patient is Muslim) suicide is not an option. She denies having auditory or visual hallucinations. She denies having symptoms consistent with mania or hypomania.  Patient came to the [place] about 20 years ago with her husband in order to complete graduate school. The patient was able to obtain a master degree at that time. For many years her main focus was to stay at home and raise their children. She has 3 children with her husband ages 40, 34 and 39 year old. This 67 year old child has autism. 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 She is now divorced from her husband they have been separated since 2007. After the divorce the patient was receiving only child support for this 57 and the 74 year old children but no alimony. The 76 year old recently had severe psychiatric issues and was hospitalized and eventually was decided that it was best for her to move in with her father who lives in [place].  The patient lost the income from this child. She is not taking care of this 16-year-old child who has autism.  Patient is undergoing severe financial stress. 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 But this is not the case anymore.  Patient has family in [place].  There she has a sister and a brother. She does not have any family here in [place]. In terms of trauma the patient denies ever being physically or sexually abuse in the past or undergone any type of trauma.  Substance abuse patient denies abusing any alcohol or any illicit substances.  Patient will benefit from crisis stabilization, medication evaluation, group therapy, and psycho education in addition to case management for discharge planning. Patient and CSW reviewed pt's identified goals and treatment plan. Pt verbalized understanding and agreed to treatment plan.    Review of initial/current patient goals per problem list:  1. Goal(s): Patient will participate in aftercare plan   Met: Yes  Target date: 3-5 days post admission date   As evidenced by: Patient will participate within aftercare plan AEB aftercare provider and housing plan at discharge being identified.   4/26: Pt will discharge home to [place] to live with her family and follow up with the [facility] [place] for medication management and therapy  4/28: Pt will discharge home to [place] to live with her family and follow up with the [facility] [place] for medication management and therapy, with [facility] for her hospital follow up, with [facility] for a counseling session assisting the pt with resources in the community and with the [facility]ism [facility] of  at [place] for needed resources to assist the pt with her autistic daughter upon discharge.    2. Goal (s): Patient will exhibit decreased depressive symptoms and suicidal ideations.   Met: Adequate for discharge per MD.  Target date: 3-5 days post admission date   As evidenced by: Patient will utilize self-rating of depression at 3 or below and demonstrate decreased signs of depression or be deemed stable for discharge by MD.   4/26: Goal progressing.  4/28: Adequate for  discharge per MD.  Pt denies SI/HI.  Pt reports he is safe for discharge.   3. Goal(s): Patient will demonstrate decreased signs and symptoms of anxiety.   Met: Adequate for discharge per MD.  Target date: 3-5 days post admission date   As evidenced by: Patient will utilize self-rating of anxiety at 3 or below and demonstrated decreased signs of anxiety, or be deemed stable for discharge by MD   4/26: Goal progressing.  4/28: Adequate for discharge per MD.  Pt reports baseline symptoms of anxiety     Patient: Jane Munoz Family:  Physician: Dr. Hernandez, MD    4/28/017 9:30 AM  Nursing: Gigi Maaniatu     09/01/2015 9:30 AM Clinical Social Worker: Jonathan Riffey, LCSWA  09/01/15 9:30 AM  Clinical Social Worker: Sara Laws, LCSWA  09/01/15 9:30 AM  Recreational Therapist: Beth Greene, LRT    4/28/179:30 AM  Other:        09/01/15 9:30 AM  Other:        09/01/15 9:30 AM   Jonathan F. Riffey, LCSWA, LCAS 4/28/7   

## 2015-09-01 NOTE — Progress Notes (Signed)
Recreation Therapy Notes  Date: 04.28.17 Time: 1:00 pm Location: Craft Room  Group Topic: Communication, Problem solving, Teamwork  Goal Area(s) Addresses:  Patient will effectively work with peer towards shared goal. Patient will identify skills used to make activity successful. Patient will identify benefit of using group skills effectively post d/c.  Behavioral Response: Attentive, Interactive, Left early  Intervention: [employer] Tower  Activity: Patients were given 15 pipe cleaners and instructed to build a free standing tower using al 15 pipe cleaners. Patients were given 2 minutes to strategize. After approximately 5 minutes of building, patients were instructed to put their dominant hand behind their back. After approximately 3 minutes, patients were instructed to stop talking to each other.  Education:LRT educated patients on healthy support systems.  Education Outcome: Patient left before LRT educated group.  Clinical Observations/Feedback: Patient worked with peers to build a tower. Patient used effective communication, problem solving, and teamwork skills. Patient left group at approximately 1:36 pm. Patient did not return to group.  [name],Kemonte Ullman M, LRT/CTRS 09/01/2015 3:24 PM

## 2015-09-01 NOTE — Progress Notes (Signed)
Patient denies SI/HI, denies A/V hallucinations. Patient verbalizes understanding of discharge instructions, follow up care and prescriptions. Patient given all belongings from  locker. Patient escorted out by staff, transported by friend. 

## 2015-09-01 NOTE — Progress Notes (Signed)
D: Pt affect is sad this evening. She remains in her room most of the evening. Denies SI/HI/AVH at this time. Denies pain. Pt reports that her goal for today was "to work on my self esteem." Pt discusses with Clinical research associatewriter ways that she works on her self esteem, including writing positive sentences about herself.  A: Emotional support and encouragement provided. Medication administered with education. q15 minute safety checks maintained. R: Pt remains free from harm. Will continue to monitor.

## 2015-09-07 ENCOUNTER — Ambulatory Visit: Payer: Self-pay | Admitting: Family Medicine

## 2017-01-23 ENCOUNTER — Encounter: Payer: Self-pay | Admitting: Family Medicine

## 2017-05-15 ENCOUNTER — Encounter: Payer: Self-pay | Admitting: Family Medicine
